# Patient Record
Sex: Male | Born: 1955
Health system: Southern US, Community
[De-identification: ages and names within clinical notes are randomized; demographics above are authoritative.]

## PROBLEM LIST (undated history)

## (undated) HISTORY — PX: HERNIA REPAIR: SHX51

---

## 2019-02-21 NOTE — Progress Notes (Addendum)
[place] [facility] [address], [place], [place] [address] [phone]  Date:  02/23/2019   Name:  Jacob Estes   DOB:  03-09-1956   MRN:  [id number]  PCP:  Darreld Mclean, MD    Chief Complaint: New Patient (Initial Visit) and Gastroesophageal Reflux   History of Present Illness:  Jacob Estes is a 63 y.o. very pleasant male patient who presents with the following:  Here today as a new patient to establish care His doctor is retiring- Dr Lajoyce Corners- so he needs to find someone new.  A friend of his is one of my patients and recommended that he see me He has lived in [place] for 40 years He is generally in good health Both of his parents are alive and doing well- his father does have skin cancer, and his mom had a heart valve replacement.  However generally they are vigorous and young for age  He has had 3 hernia operations in the past  He did get divorced 20 years ago- had some anxiety at that point and had palpitations They did find a "spot" on his lung at that time, benign ?granuloma He does tend to cough, he saw a doctor at [place] who thought he might have GERD- he was started on omeprazole which has helped with his cough and his phlegm production   He is using a natural supplement now in place of omeprazole which does seem to help- he ran out of his omeprazole.  Wonders if okay to continue to use this  He tends to have a limited appetite and will get sick if he overeats.   He has always been on the slim side He uses a fiber supplement which helps with his bowel regularity However for the last 6 months his bowels have been looser- not diarrhea- still goes daily No blood or mucus He notes that his stools are recently sticky and messy- he is having to go to the bathroom several times a day "to clean up" No recent abx use  He is not having diarrhea, so doubt C. difficile He did have giardia years ago- these sx were  reminiscent.  [name] did a stool sample for him which was negative for Giardia.  They also did an ultrasound which showed a "cyst on my liver" that is thought to be benign   He has had a couple of colonoscopies, most recently about 4 years ago.  His GI doc is [name]  He does exercise on a regular basis.  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  Graduated from [employer] in [employer]  He has 2 grown kids- daughter 44 and son is 2 yo, 2 grands just about a year old.  They live in [place] and [place]   There are no active problems to display for this patient.   History reviewed. No pertinent past medical history.  Past Surgical History:  Procedure Laterality Date  . HERNIA REPAIR      Social History   Tobacco Use  . Smoking status: Never Smoker  . Smokeless tobacco: Never Used  Substance  Use Topics  . Alcohol use: Yes    Comment: very very little  . Drug use: Not on file    History reviewed. No pertinent family history.  Allergies  Allergen Reactions  . Codeine Hives and Other (See Comments)    Heart problems Heart problems     Medication list has been reviewed and updated.  Current Outpatient Medications on File Prior to Visit  Medication Sig Dispense Refill  . omeprazole (PRILOSEC) 40 MG capsule Take 40 mg by mouth daily.     No current facility-administered medications on file prior to visit.     Review of Systems:  As per HPI- otherwise negative.   Physical Examination: Vitals:   02/23/19 0858  BP: 122/70  Pulse: 75  Resp: 16  Temp: (!) 97.1 F (36.2 C)  SpO2: 98%   Vitals:   02/23/19 0858  Weight: 183 lb (83 kg)  Height: 6\' 2"  (1.88 m)   Body mass  index is 23.5 kg/m. Ideal Body Weight: Weight in (lb) to have BMI = 25: 194.3  GEN: WDWN, NAD, Non-toxic, A & O x 3, looks well, slim build  HEENT: Atraumatic, Normocephalic. Neck supple. No masses, No LAD.  TM wnl  Ears and Nose: No external deformity. CV: RRR, No M/G/R. No JVD. No thrill. No extra heart sounds. PULM: CTA B, no wheezes, crackles, rhonchi. No retractions. No resp. distress. No accessory muscle use. ABD: S, NT, ND, +BS. No rebound. No HSM. EXTR: No c/c/e NEURO Normal gait.  PSYCH: Normally interactive. Conversant. Not depressed or anxious appearing.  Calm demeanor.  Onychomycosis is evident on his toenails   Assessment and Plan: Onychomycosis - Plan: ciclopirox (PENLAC) 8 % solution  Screening for diabetes mellitus - Plan: Comprehensive metabolic panel, Hemoglobin A1c  Screening for prostate cancer - Plan: PSA  Screening for hyperlipidemia - Plan: Lipid panel  Screening for deficiency anemia - Plan: CBC  Encounter for hepatitis C screening test for low risk patient - Plan: Hepatitis C antibody  Gastroesophageal reflux disease, unspecified whether esophagitis present - Plan: omeprazole (PRILOSEC) 40 MG capsule  Immunization due - Plan: Pneumococcal conjugate vaccine 13-valent IM  Bowel habit changes  Here today as a new patient to establish care and discuss a couple of concerns Give first pneumonia vaccine today, he plans to have shingles vaccine done at his drugstore Labs pending as above, will be in touch with the results We will treat his onychomycosis with Penlac, if not covered he will let me know and I can prescribe Lamisil. He preferred to try topical agent first if possible He notes bowel habit changes for several months, persistent.  I recommended that he follow-up with his gastroenterologist.  She may wish to repeat his colonoscopy as it has been a few years He can continue to use omeprazole as needed.  He might try pulse therapy so he does not have to  use it every day  Encourage continued exercise and healthy diet  Will plan further follow- up pending labs.   Signed Lamar Blinks, MD  Received his labs 11/5, letter to patient as mychart not yet set up  Results for orders placed or performed in visit on 02/23/19  CBC  Result Value Ref Range   WBC 4.2 4.0 - 10.5 K/uL   RBC 5.48 4.22 - 5.81 Mil/uL   Platelets 196.0 150.0 - 400.0 K/uL   Hemoglobin 16.0 13.0 - 17.0 g/dL   HCT 48.7 39.0 - 52.0 %   MCV 88.9 78.0 -  100.0 fl   MCHC 32.9 30.0 - 36.0 g/dL   RDW 14.2 11.5 - 15.5 %  Comprehensive metabolic panel  Result Value Ref Range   Sodium 139 135 - 145 mEq/L   Potassium 4.6 3.5 - 5.1 mEq/L   Chloride 103 96 - 112 mEq/L   CO2 30 19 - 32 mEq/L   Glucose, Bld 97 70 - 99 mg/dL   BUN 13 6 - 23 mg/dL   Creatinine, Ser 1.12 0.40 - 1.50 mg/dL   Total Bilirubin 0.6 0.2 - 1.2 mg/dL   Alkaline Phosphatase 36 (L) 39 - 117 U/L   AST 18 0 - 37 U/L   ALT 17 0 - 53 U/L   Total Protein 6.7 6.0 - 8.3 g/dL   Albumin 4.4 3.5 - 5.2 g/dL   Calcium 9.5 8.4 - 10.5 mg/dL   GFR 65.80 >60.00 mL/min  Hemoglobin A1c  Result Value Ref Range   Hgb A1c MFr Bld 5.5 4.6 - 6.5 %  Lipid panel  Result Value Ref Range   Cholesterol 178 0 - 200 mg/dL   Triglycerides 51.0 0.0 - 149.0 mg/dL   HDL 53.80 >39.00 mg/dL   VLDL 10.2 0.0 - 40.0 mg/dL   LDL Cholesterol 114 (H) 0 - 99 mg/dL   Total CHOL/HDL Ratio 3    NonHDL 124.45   PSA  Result Value Ref Range   PSA 0.93 0.10 - 4.00 ng/mL

## 2019-02-22 ENCOUNTER — Other Ambulatory Visit: Payer: Self-pay

## 2019-02-23 ENCOUNTER — Encounter: Payer: Self-pay | Admitting: Family Medicine

## 2019-02-23 ENCOUNTER — Ambulatory Visit (INDEPENDENT_AMBULATORY_CARE_PROVIDER_SITE_OTHER): Payer: Medicare Other | Admitting: Family Medicine

## 2019-02-23 VITALS — BP 122/70 | HR 75 | Temp 97.1°F | Resp 16 | Ht 74.0 in | Wt 183.0 lb

## 2019-02-23 DIAGNOSIS — B351 Tinea unguium: Secondary | ICD-10-CM

## 2019-02-23 DIAGNOSIS — Z23 Encounter for immunization: Secondary | ICD-10-CM

## 2019-02-23 DIAGNOSIS — Z1159 Encounter for screening for other viral diseases: Secondary | ICD-10-CM

## 2019-02-23 DIAGNOSIS — Z13220 Encounter for screening for lipoid disorders: Secondary | ICD-10-CM

## 2019-02-23 DIAGNOSIS — Z125 Encounter for screening for malignant neoplasm of prostate: Secondary | ICD-10-CM | POA: Diagnosis not present

## 2019-02-23 DIAGNOSIS — Z130 Encounter for screening for diseases of the blood and blood-forming organs and certain disorders involving the immune mechanism: Secondary | ICD-10-CM | POA: Diagnosis not present

## 2019-02-23 DIAGNOSIS — Z131 Encounter for screening for diabetes mellitus: Secondary | ICD-10-CM | POA: Diagnosis not present

## 2019-02-23 DIAGNOSIS — K219 Gastro-esophageal reflux disease without esophagitis: Secondary | ICD-10-CM

## 2019-02-23 DIAGNOSIS — R194 Change in bowel habit: Secondary | ICD-10-CM

## 2019-02-23 LAB — COMPREHENSIVE METABOLIC PANEL
ALT: 17 U/L (ref 0–53)
AST: 18 U/L (ref 0–37)
Albumin: 4.4 g/dL (ref 3.5–5.2)
Alkaline Phosphatase: 36 U/L — ABNORMAL LOW (ref 39–117)
BUN: 13 mg/dL (ref 6–23)
CO2: 30 mEq/L (ref 19–32)
Calcium: 9.5 mg/dL (ref 8.4–10.5)
Chloride: 103 mEq/L (ref 96–112)
Creatinine, Ser: 1.12 mg/dL (ref 0.40–1.50)
GFR: 65.8 mL/min (ref >60.00)
Glucose, Bld: 97 mg/dL (ref 70–99)
Potassium: 4.6 mEq/L (ref 3.5–5.1)
Sodium: 139 mEq/L (ref 135–145)
Total Bilirubin: 0.6 mg/dL (ref 0.2–1.2)
Total Protein: 6.7 g/dL (ref 6.0–8.3)

## 2019-02-23 LAB — CBC
HCT: 48.7 % (ref 39.0–52.0)
Hemoglobin: 16 g/dL (ref 13.0–17.0)
MCHC: 32.9 g/dL (ref 30.0–36.0)
MCV: 88.9 fl (ref 78.0–100.0)
Platelets: 196 10*3/uL (ref 150.0–400.0)
RBC: 5.48 Mil/uL (ref 4.22–5.81)
RDW: 14.2 % (ref 11.5–15.5)
WBC: 4.2 10*3/uL (ref 4.0–10.5)

## 2019-02-23 LAB — PSA: PSA: 0.93 ng/mL (ref 0.10–4.00)

## 2019-02-23 LAB — LIPID PANEL
Cholesterol: 178 mg/dL (ref 0–200)
HDL: 53.8 mg/dL (ref >39.00)
LDL Cholesterol: 114 mg/dL — ABNORMAL HIGH (ref 0–99)
NonHDL: 124.45
Total CHOL/HDL Ratio: 3
Triglycerides: 51 mg/dL (ref 0.0–149.0)
VLDL: 10.2 mg/dL (ref 0.0–40.0)

## 2019-02-23 LAB — HEMOGLOBIN A1C: Hgb A1c MFr Bld: 5.5 % (ref 4.6–6.5)

## 2019-02-23 MED ORDER — CICLOPIROX 8 % EX SOLN: Freq: Every day | CUTANEOUS | 6 refills | Status: DC

## 2019-02-23 MED ORDER — OMEPRAZOLE 40 MG PO CPDR: 40.0000 mg | DELAYED_RELEASE_CAPSULE | Freq: Every day | ORAL | 3 refills | Status: DC

## 2019-02-23 NOTE — Patient Instructions (Signed)
Good to meet you today- take care and I will be in touch with your labs asap You got your first dose of pneumonia vaccine today- next due in one year Please have your shingles vaccine at the pharmacy at your convenience- Shingrix, 2 doses   Try the Penlac nail laquer for fungal nails- if not covered by your insurance we can use lamisil orally for 12 weeks- please let me know if you need me to make this change   Please follow-up with Dr. Shana Chute regarding your bowel habit change -  [phone]  I will communicate all lab results to you always- if you do not hear from me something went wrong and please contact me.  Will use mychart preferentially if set up

## 2019-02-24 LAB — HEPATITIS C ANTIBODY
Hepatitis C Ab: NONREACTIVE
SIGNAL TO CUT-OFF: 0.01 (ref <1.00)

## 2019-03-03 DIAGNOSIS — R198 Other specified symptoms and signs involving the digestive system and abdomen: Secondary | ICD-10-CM | POA: Diagnosis not present

## 2019-03-03 DIAGNOSIS — Z86010 Personal history of colonic polyps: Secondary | ICD-10-CM | POA: Diagnosis not present

## 2019-03-03 DIAGNOSIS — R1310 Dysphagia, unspecified: Secondary | ICD-10-CM | POA: Diagnosis not present

## 2019-03-03 DIAGNOSIS — R05 Cough: Secondary | ICD-10-CM | POA: Diagnosis not present

## 2019-04-05 DIAGNOSIS — K5730 Diverticulosis of large intestine without perforation or abscess without bleeding: Secondary | ICD-10-CM | POA: Diagnosis not present

## 2019-04-05 DIAGNOSIS — K2960 Other gastritis without bleeding: Secondary | ICD-10-CM | POA: Diagnosis not present

## 2019-04-05 DIAGNOSIS — D123 Benign neoplasm of transverse colon: Secondary | ICD-10-CM | POA: Diagnosis not present

## 2019-04-05 DIAGNOSIS — R194 Change in bowel habit: Secondary | ICD-10-CM | POA: Diagnosis not present

## 2019-04-05 DIAGNOSIS — K2950 Unspecified chronic gastritis without bleeding: Secondary | ICD-10-CM | POA: Diagnosis not present

## 2019-04-05 DIAGNOSIS — K6389 Other specified diseases of intestine: Secondary | ICD-10-CM | POA: Diagnosis not present

## 2019-04-05 DIAGNOSIS — Z86010 Personal history of colonic polyps: Secondary | ICD-10-CM | POA: Diagnosis not present

## 2019-04-05 DIAGNOSIS — K529 Noninfective gastroenteritis and colitis, unspecified: Secondary | ICD-10-CM | POA: Diagnosis not present

## 2019-04-05 DIAGNOSIS — D125 Benign neoplasm of sigmoid colon: Secondary | ICD-10-CM | POA: Diagnosis not present

## 2019-04-05 DIAGNOSIS — R1310 Dysphagia, unspecified: Secondary | ICD-10-CM | POA: Diagnosis not present

## 2019-04-05 DIAGNOSIS — K635 Polyp of colon: Secondary | ICD-10-CM | POA: Diagnosis not present

## 2019-04-05 LAB — HM COLONOSCOPY

## 2019-04-18 DIAGNOSIS — K5730 Diverticulosis of large intestine without perforation or abscess without bleeding: Secondary | ICD-10-CM | POA: Diagnosis not present

## 2019-04-18 DIAGNOSIS — R05 Cough: Secondary | ICD-10-CM | POA: Diagnosis not present

## 2019-04-18 DIAGNOSIS — K2970 Gastritis, unspecified, without bleeding: Secondary | ICD-10-CM | POA: Diagnosis not present

## 2019-04-18 DIAGNOSIS — T17308D Unspecified foreign body in larynx causing other injury, subsequent encounter: Secondary | ICD-10-CM | POA: Diagnosis not present

## 2019-04-18 DIAGNOSIS — K219 Gastro-esophageal reflux disease without esophagitis: Secondary | ICD-10-CM | POA: Diagnosis not present

## 2019-06-07 ENCOUNTER — Ambulatory Visit: Payer: Medicare Other | Attending: Internal Medicine

## 2019-06-07 DIAGNOSIS — Z20822 Contact with and (suspected) exposure to covid-19: Secondary | ICD-10-CM

## 2019-06-08 ENCOUNTER — Telehealth: Payer: Self-pay | Admitting: Physician Assistant

## 2019-06-08 ENCOUNTER — Other Ambulatory Visit: Payer: Self-pay | Admitting: Physician Assistant

## 2019-06-08 DIAGNOSIS — U071 COVID-19: Secondary | ICD-10-CM

## 2019-06-08 DIAGNOSIS — R54 Age-related physical debility: Secondary | ICD-10-CM

## 2019-06-08 LAB — NOVEL CORONAVIRUS, NAA: SARS-CoV-2, NAA: DETECTED — AB

## 2019-06-08 NOTE — Telephone Encounter (Signed)
Called to discuss with Guinevere Scarlet III about Covid symptoms and the use of bamlanivimab or casirivimab/imdevimab, a monoclonal antibody infusion for those with mild to moderate Covid symptoms and at a high risk of hospitalization.     Pt is qualified for this infusion at the [facility] infusion center due to co-morbid conditions and/or a member of an at-risk group (age >47), however declines infusion at this time. Symptoms tier reviewed as well as criteria for ending isolation.  Symptoms reviewed that would warrant ED/Hospital evaluation. Preventative practices reviewed. Patient verbalized understanding. Patient advised to call back if he decides that he does want to get infusion. Callback number to the infusion center given. Patient advised to go to Urgent care or ED with severe symptoms. Last date pt would be eligible for infusion is 06/14/19. He would like to think about it. I have sent him a My Chart message with information on how to get a hold of [place] if he wants to get the infusion.    Angelena Form PA-C

## 2019-06-08 NOTE — Progress Notes (Signed)
  I connected by phone with Jacob Estes on 06/08/2019 at 8:31 PM to discuss the potential use of an new treatment for mild to moderate COVID-19 viral infection in non-hospitalized patients.  This patient is a 64 y.o. male that meets the FDA criteria for Emergency Use Authorization of bamlanivimab or casirivimab\imdevimab.  Has a (+) direct SARS-CoV-2 viral test result  Has mild or moderate COVID-19   Is ? 66 years of age and weighs ? 40 kg  Is NOT hospitalized due to COVID-19  Is NOT requiring oxygen therapy or requiring an increase in baseline oxygen flow rate due to COVID-19  Is within 10 days of symptom onset  Has at least one of the high risk factor(s) for progression to severe COVID-19 and/or hospitalization as defined in EUA.  Specific high risk criteria : >/= 66 yo   I have spoken and communicated the following to the patient or parent/caregiver:  1. FDA has authorized the emergency use of bamlanivimab and casirivimab\imdevimab for the treatment of mild to moderate COVID-19 in adults and pediatric patients with positive results of direct SARS-CoV-2 viral testing who are 71 years of age and older weighing at least 40 kg, and who are at high risk for progressing to severe COVID-19 and/or hospitalization.  2. The significant known and potential risks and benefits of bamlanivimab and casirivimab\imdevimab, and the extent to which such potential risks and benefits are unknown.  3. Information on available alternative treatments and the risks and benefits of those alternatives, including clinical trials.  4. Patients treated with bamlanivimab and casirivimab\imdevimab should continue to self-isolate and use infection control measures (e.g., wear mask, isolate, social distance, avoid sharing personal items, clean and disinfect "high touch" surfaces, and frequent handwashing) according to CDC guidelines.   5. The patient or parent/caregiver has the option to accept or refuse  bamlanivimab or casirivimab\imdevimab .  After reviewing this information with the patient, The patient agreed to proceed with receiving the bamlanimivab infusion and will be provided a copy of the Fact sheet prior to receiving the infusion.   Sx onset 2/13. Set up for infusion tomorrow 2/18 @ 2:30pm (subject to change given possible inclement weather).    Angelena Form PA-C 06/08/2019 8:31 PM   CC: Dr. Lorelei Pont

## 2019-06-09 ENCOUNTER — Ambulatory Visit (HOSPITAL_COMMUNITY)
Admission: RE | Admit: 2019-06-09 | Discharge: 2019-06-09 | Disposition: A | Discharge status: Home / Self Care | Payer: Medicare Other | Source: Ambulatory Visit | Attending: Pulmonary Disease | Admitting: Pulmonary Disease

## 2019-06-09 ENCOUNTER — Telehealth (HOSPITAL_COMMUNITY): Payer: Self-pay | Admitting: Nurse Practitioner

## 2019-06-09 DIAGNOSIS — U071 COVID-19: Secondary | ICD-10-CM | POA: Insufficient documentation

## 2019-06-09 DIAGNOSIS — R54 Age-related physical debility: Secondary | ICD-10-CM | POA: Diagnosis present

## 2019-06-09 DIAGNOSIS — Z23 Encounter for immunization: Secondary | ICD-10-CM | POA: Insufficient documentation

## 2019-06-09 MED ORDER — FAMOTIDINE IN NACL 20-0.9 MG/50ML-% IV SOLN: 20.0000 mg | Freq: Once | INTRAVENOUS | Status: DC | PRN

## 2019-06-09 MED ORDER — EPINEPHRINE 0.3 MG/0.3ML IJ SOAJ: 0.3000 mg | Freq: Once | INTRAMUSCULAR | Status: DC | PRN

## 2019-06-09 MED ORDER — SODIUM CHLORIDE 0.9 % IV SOLN
INTRAVENOUS | Status: DC | PRN
  Administered 2019-06-09: 14:00:00 250 mL via INTRAVENOUS

## 2019-06-09 MED ORDER — ALBUTEROL SULFATE HFA 108 (90 BASE) MCG/ACT IN AERS: 2.0000 | INHALATION_SPRAY | Freq: Once | RESPIRATORY_TRACT | Status: DC | PRN

## 2019-06-09 MED ORDER — DIPHENHYDRAMINE HCL 50 MG/ML IJ SOLN: 50.0000 mg | Freq: Once | INTRAMUSCULAR | Status: DC | PRN

## 2019-06-09 MED ORDER — SODIUM CHLORIDE 0.9 % IV SOLN
700.0000 mg | Freq: Once | INTRAVENOUS | Status: AC
  Administered 2019-06-09: 15:00:00 700 mg via INTRAVENOUS
  Filled 2019-06-09: qty 20

## 2019-06-09 MED ORDER — METHYLPREDNISOLONE SODIUM SUCC 125 MG IJ SOLR: 125.0000 mg | Freq: Once | INTRAMUSCULAR | Status: DC | PRN

## 2019-06-09 NOTE — Telephone Encounter (Signed)
Called to Discuss with patient about Covid symptoms and the use of bamlanivimab, a monoclonal antibody infusion for those with mild to moderate Covid symptoms and at a high risk of hospitalization.     Pt is qualified for this infusion at the [facility] infusion center due to co-morbid conditions and/or a member of an at-risk group.  He has spoken to [facility] and is scheduled to get MAB treatment today. We discussed symptoms and medications to help control symptoms in the interim. ER precautions discussed.   Beckey Rutter, [place], AGNP-C [phone] ([place])

## 2019-06-09 NOTE — Discharge Instructions (Signed)

## 2019-06-09 NOTE — Progress Notes (Signed)
  Diagnosis: COVID-19  Physician: Dr. Joya Gaskins   Procedure: Covid Infusion Clinic Med: bamlanivimab infusion - Provided patient with bamlanimivab fact sheet for patients, parents and caregivers prior to infusion.  Complications: No immediate complications noted.  Discharge: Discharged home   Jacob Estes 06/09/2019

## 2019-06-21 ENCOUNTER — Encounter: Payer: Self-pay | Admitting: Family Medicine

## 2019-07-19 DIAGNOSIS — R05 Cough: Secondary | ICD-10-CM | POA: Diagnosis not present

## 2019-07-19 DIAGNOSIS — R1313 Dysphagia, pharyngeal phase: Secondary | ICD-10-CM | POA: Diagnosis not present

## 2019-07-19 DIAGNOSIS — T17308D Unspecified foreign body in larynx causing other injury, subsequent encounter: Secondary | ICD-10-CM | POA: Diagnosis not present

## 2019-07-27 ENCOUNTER — Encounter: Payer: Self-pay | Admitting: Family Medicine

## 2019-10-31 ENCOUNTER — Encounter: Payer: Self-pay | Admitting: Family Medicine

## 2019-10-31 NOTE — Telephone Encounter (Signed)
Last ov 02/23/2019-please advise on other patients concern.

## 2019-11-09 DIAGNOSIS — L814 Other melanin hyperpigmentation: Secondary | ICD-10-CM | POA: Diagnosis not present

## 2019-11-09 DIAGNOSIS — L820 Inflamed seborrheic keratosis: Secondary | ICD-10-CM | POA: Diagnosis not present

## 2019-11-09 DIAGNOSIS — X32XXXS Exposure to sunlight, sequela: Secondary | ICD-10-CM | POA: Diagnosis not present

## 2019-11-09 DIAGNOSIS — B351 Tinea unguium: Secondary | ICD-10-CM | POA: Diagnosis not present

## 2019-11-09 DIAGNOSIS — L821 Other seborrheic keratosis: Secondary | ICD-10-CM | POA: Diagnosis not present

## 2019-11-14 DIAGNOSIS — B351 Tinea unguium: Secondary | ICD-10-CM | POA: Diagnosis not present

## 2019-11-14 DIAGNOSIS — Z79899 Other long term (current) drug therapy: Secondary | ICD-10-CM | POA: Diagnosis not present

## 2019-12-21 DIAGNOSIS — Z79899 Other long term (current) drug therapy: Secondary | ICD-10-CM | POA: Diagnosis not present

## 2019-12-21 DIAGNOSIS — B351 Tinea unguium: Secondary | ICD-10-CM | POA: Diagnosis not present

## 2020-01-30 ENCOUNTER — Encounter: Payer: Self-pay | Admitting: Family Medicine

## 2020-02-07 NOTE — Progress Notes (Addendum)
[place] at [facility] [address] [place], [place] [address] [phone]  Date:  02/08/2020   Name:  Jacob Estes   DOB:  12/06/1955   MRN:  [id number]  PCP:  Darreld Mclean, MD    Chief Complaint: Anxiety (2 weeks ago, waking up in the night in a panic), Cough (coughing up thick phlem, feels as though hes drowning), and Smell of Smoke   History of Present Illness:  Jacob Estes is a 64 y.o. very pleasant male patient who presents with the following:  Generally healthy man here today for follow-up Our last routine visit was about 1 year ago History of 3 hernia operations, GERD He is divorced, works as a Programme researcher, broadcasting/film/video.  He has 2 grown children who live out of state and 2 grandchildren-everyone in his family is doing well He works as much as he likes to at this point- he just did [employer] but does not feel this was stressful.   His parents are getting older - this is a bit of a stressor, his mother struggling some with depression  He became ill with COVID-19 in February, treated with monoclonal antibody infusion He had received 1 dose of Moderna COVID-19 vaccine, became ill the next day and was diagnosed with Covid  18 months ago or so he was having some issues with waking up in the night feeling like he "was drowning," he went to see GI and they did an upper and lower GI.  Results were ok, he was started on omeprazole He seemed to be doing okay in this regard, until 2 weeks ago he apparently woke up with what seemed like a panic attack-he felt like he was choking, he was not sure if he really has not caught in his throat or if he was just panicking.  He notes he has been suffering more from anxiety since this happened and has had a couple more panic attacks He got up 4x last night to urinate which is unusual for him  He has suffered from panic in the past, when he was separated from his first wife-however  more recently his life has be very good and he has not experienced anxiety or depression  He does note he may feel chest pressure when he is very anxious-he thinks this is just anxiety, no chest pain Never smoker, does not drink to excess  Colon cancer screening- last year  Flu vaccine- done  He finished his covid 19 vaccine series Most recent labs on chart from November 2020, can update today  He does exercise on a regular basis with no CP or SOB with exertion -he does both weightlifting and cardio workout several times a week  He also recently notes that he may smell cigarette smoke in the air when there is none, and also has bilateral, nonpulsatile tinnitus There are no problems to display for this patient.   History reviewed. No pertinent past medical history.  Past Surgical History:  Procedure Laterality Date  . HERNIA REPAIR      Social History   Tobacco Use  . Smoking status: Never Smoker  . Smokeless tobacco: Never Used  Substance Use Topics  . Alcohol use: Yes    Comment: very very little  . Drug use: Not on file    History reviewed. No pertinent family history.  Allergies  Allergen Reactions  . Codeine Hives and Other (See Comments)  Heart problems Heart problems     Medication list has been reviewed and updated.  Current Outpatient Medications on File Prior to Visit  Medication Sig Dispense Refill  . loratadine (CLARITIN) 10 MG tablet Take 10 mg by mouth daily.    Marland Kitchen omeprazole (PRILOSEC) 40 MG capsule Take 1 capsule (40 mg total) by mouth daily. Use as needed for GERD sx 90 capsule 3  . terbinafine (LAMISIL) 250 MG tablet Take 250 mg by mouth daily.     No current facility-administered medications on file prior to visit.    Review of Systems:  As per HPI- otherwise negative.   Physical Examination: Vitals:   02/08/20 1357  BP: 136/80  Pulse: 76  Resp: 17  SpO2: 96%   Vitals:   02/08/20 1357  Weight: 179 lb (81.2 kg)  Height: 6\' 2"   (1.88 m)   Body mass index is 22.98 kg/m. Ideal Body Weight: Weight in (lb) to have BMI = 25: 194.3  GEN: no acute distress.  Slim build, looks well HEENT: Atraumatic, Normocephalic.  Bilateral TM wnl, oropharynx normal.  PEERL,EOMI.    Ears and Nose: No external deformity. CV: RRR, No M/G/R. No JVD. No thrill. No extra heart sounds. PULM: CTA B, no wheezes, crackles, rhonchi. No retractions. No resp. distress. No accessory muscle use. ABD: S, NT, ND, +BS. No rebound. No HSM. EXTR: No c/c/e PSYCH: Normally interactive. Conversant.   EKG- no old tracing for comparison SR with possible LAE.  No concerning ST changes  Assessment and Plan: Screening for diabetes mellitus - Plan: Comprehensive metabolic panel, Hemoglobin A1c  Screening for prostate cancer - Plan: PSA  Screening for deficiency anemia - Plan: CBC  Screening for hyperlipidemia - Plan: Lipid panel  Urinary urgency - Plan: Urine Culture, tamsulosin (FLOMAX) 0.4 MG CAPS capsule  Chest pressure - Plan: CANCELED: EKG 12-Lead  Chest tightness - Plan: EKG 12-Lead  Panic attack - Plan: ALPRAZolam (XANAX) 0.5 MG tablet     Patient here today with likely panic attack, perhaps exacerbated by throat irritation due to GERD or postnasal drainage He will start back on loratadine, and will also purchase an over-the-counter nasal steroid spray He had a recent upper GI per an outside gastroenterology practice, I have requested this report EKG at this time is reassuring, and he does not have any symptoms with vigorous exercise.  However, I have asked him to let me know if his symptoms change or worsen, or if he begins to have any symptoms with exercise  I gave him a few alprazolam to have on hand and use as needed for panic I offered to have him see ENT for tinnitus, he declines for the time being  We will be in touch with his labs as soon as possible  He has noted some symptoms of urinary frequency recently, no pain.  Suspect he  likely has BPH.  He is interested in trying Flomax which we will prescribe.  Also will check a urine culture to rule out UTI This visit occurred during the SARS-CoV-2 public health emergency.  Safety protocols were in place, including screening questions prior to the visit, additional usage of staff PPE, and extensive cleaning of exam room while observing appropriate contact time as indicated for disinfecting solutions.    Signed Lamar Blinks, MD  Addendum 10/21, received labs as below.  Urine culture pending Results for orders placed or performed in visit on 02/08/20  CBC  Result Value Ref Range   WBC 6.0 3.8 -  10.8 Thousand/uL   RBC 5.14 4.20 - 5.80 Million/uL   Hemoglobin 15.5 13.2 - 17.1 g/dL   HCT 45.5 38 - 50 %   MCV 88.5 80.0 - 100.0 fL   MCH 30.2 27.0 - 33.0 pg   MCHC 34.1 32.0 - 36.0 g/dL   RDW 13.1 11.0 - 15.0 %   Platelets 204 140 - 400 Thousand/uL   MPV 11.2 7.5 - 12.5 fL  Comprehensive metabolic panel  Result Value Ref Range   Glucose, Bld 64 (L) 65 - 99 mg/dL   BUN 13 7 - 25 mg/dL   Creat 1.08 0.70 - 1.25 mg/dL   BUN/Creatinine Ratio NOT APPLICABLE 6 - 22 (calc)   Sodium 140 135 - 146 mmol/L   Potassium 4.1 3.5 - 5.3 mmol/L   Chloride 105 98 - 110 mmol/L   CO2 26 20 - 32 mmol/L   Calcium 9.5 8.6 - 10.3 mg/dL   Total Protein 6.6 6.1 - 8.1 g/dL   Albumin 4.4 3.6 - 5.1 g/dL   Globulin 2.2 1.9 - 3.7 g/dL (calc)   AG Ratio 2.0 1.0 - 2.5 (calc)   Total Bilirubin 0.5 0.2 - 1.2 mg/dL   Alkaline phosphatase (APISO) 32 (L) 35 - 144 U/L   AST 16 10 - 35 U/L   ALT 18 9 - 46 U/L  Hemoglobin A1c  Result Value Ref Range   Hgb A1c MFr Bld 5.3 <5.7 % of total Hgb   Mean Plasma Glucose 105 (calc)   eAG (mmol/L) 5.8 (calc)  Lipid panel  Result Value Ref Range   Cholesterol 180 <200 mg/dL   HDL 61 > OR = 40 mg/dL   Triglycerides 80 <150 mg/dL   LDL Cholesterol (Calc) 102 (H) mg/dL (calc)   Total CHOL/HDL Ratio 3.0 <5.0 (calc)   Non-HDL Cholesterol (Calc) 119 <130  mg/dL (calc)  PSA  Result Value Ref Range   PSA 0.65 < OR = 4.0 ng/mL

## 2020-02-08 ENCOUNTER — Other Ambulatory Visit: Payer: Self-pay

## 2020-02-08 ENCOUNTER — Ambulatory Visit (INDEPENDENT_AMBULATORY_CARE_PROVIDER_SITE_OTHER): Payer: Medicare Other | Admitting: Family Medicine

## 2020-02-08 ENCOUNTER — Encounter: Payer: Self-pay | Admitting: Family Medicine

## 2020-02-08 VITALS — BP 136/80 | HR 76 | Resp 17 | Ht 74.0 in | Wt 179.0 lb

## 2020-02-08 DIAGNOSIS — R3915 Urgency of urination: Secondary | ICD-10-CM

## 2020-02-08 DIAGNOSIS — F410 Panic disorder [episodic paroxysmal anxiety] without agoraphobia: Secondary | ICD-10-CM

## 2020-02-08 DIAGNOSIS — Z125 Encounter for screening for malignant neoplasm of prostate: Secondary | ICD-10-CM

## 2020-02-08 DIAGNOSIS — R0789 Other chest pain: Secondary | ICD-10-CM

## 2020-02-08 DIAGNOSIS — Z130 Encounter for screening for diseases of the blood and blood-forming organs and certain disorders involving the immune mechanism: Secondary | ICD-10-CM

## 2020-02-08 DIAGNOSIS — Z131 Encounter for screening for diabetes mellitus: Secondary | ICD-10-CM

## 2020-02-08 DIAGNOSIS — B351 Tinea unguium: Secondary | ICD-10-CM | POA: Diagnosis not present

## 2020-02-08 DIAGNOSIS — L821 Other seborrheic keratosis: Secondary | ICD-10-CM | POA: Diagnosis not present

## 2020-02-08 DIAGNOSIS — Z13220 Encounter for screening for lipoid disorders: Secondary | ICD-10-CM | POA: Diagnosis not present

## 2020-02-08 MED ORDER — TAMSULOSIN HCL 0.4 MG PO CAPS: 0.4000 mg | ORAL_CAPSULE | Freq: Every day | ORAL | 3 refills | Status: DC

## 2020-02-08 MED ORDER — ALPRAZOLAM 0.5 MG PO TABS: 0.5000 mg | ORAL_TABLET | Freq: Every evening | ORAL | 0 refills | Status: DC | PRN

## 2020-02-08 NOTE — Patient Instructions (Addendum)
Good to see you again today- I will be in touch with your labs asap For possible allergies/ post nasal drainage use the loratadine and perhaps also a nasal steroid spray such as flonase or nasacort I gave you some alprazolam/ xanax to use as needed for panic attack You can take a 1/2 or whole tablet up to three times a day as needed for anxiety- do not drive after taking this medication   Please let me know how you are doing over the next week or so!

## 2020-02-09 ENCOUNTER — Encounter: Payer: Self-pay | Admitting: Family Medicine

## 2020-02-09 LAB — CBC
HCT: 45.5 % (ref 38.5–50.0)
Hemoglobin: 15.5 g/dL (ref 13.2–17.1)
MCH: 30.2 pg (ref 27.0–33.0)
MCHC: 34.1 g/dL (ref 32.0–36.0)
MCV: 88.5 fL (ref 80.0–100.0)
MPV: 11.2 fL (ref 7.5–12.5)
Platelets: 204 10*3/uL (ref 140–400)
RBC: 5.14 10*6/uL (ref 4.20–5.80)
RDW: 13.1 % (ref 11.0–15.0)
WBC: 6 10*3/uL (ref 3.8–10.8)

## 2020-02-09 LAB — LIPID PANEL
Cholesterol: 180 mg/dL (ref <200)
HDL: 61 mg/dL (ref >=40)
LDL Cholesterol (Calc): 102 mg/dL (calc) — ABNORMAL HIGH
Non-HDL Cholesterol (Calc): 119 mg/dL (calc) (ref <130)
Total CHOL/HDL Ratio: 3 (calc) (ref <5.0)
Triglycerides: 80 mg/dL (ref <150)

## 2020-02-09 LAB — PSA: PSA: 0.65 ng/mL (ref <=4.0)

## 2020-02-09 LAB — HEMOGLOBIN A1C
Hgb A1c MFr Bld: 5.3 % of total Hgb (ref <5.7)
Mean Plasma Glucose: 105 (calc)
eAG (mmol/L): 5.8 (calc)

## 2020-02-09 LAB — COMPREHENSIVE METABOLIC PANEL
AG Ratio: 2 (calc) (ref 1.0–2.5)
ALT: 18 U/L (ref 9–46)
AST: 16 U/L (ref 10–35)
Albumin: 4.4 g/dL (ref 3.6–5.1)
Alkaline phosphatase (APISO): 32 U/L — ABNORMAL LOW (ref 35–144)
BUN: 13 mg/dL (ref 7–25)
CO2: 26 mmol/L (ref 20–32)
Calcium: 9.5 mg/dL (ref 8.6–10.3)
Chloride: 105 mmol/L (ref 98–110)
Creat: 1.08 mg/dL (ref 0.70–1.25)
Globulin: 2.2 g/dL (calc) (ref 1.9–3.7)
Glucose, Bld: 64 mg/dL — ABNORMAL LOW (ref 65–99)
Potassium: 4.1 mmol/L (ref 3.5–5.3)
Sodium: 140 mmol/L (ref 135–146)
Total Bilirubin: 0.5 mg/dL (ref 0.2–1.2)
Total Protein: 6.6 g/dL (ref 6.1–8.1)

## 2020-02-09 LAB — URINE CULTURE
MICRO NUMBER:: 11095994
Result:: NO GROWTH
SPECIMEN QUALITY:: ADEQUATE

## 2020-02-10 ENCOUNTER — Encounter: Payer: Self-pay | Admitting: Family Medicine

## 2020-02-18 ENCOUNTER — Other Ambulatory Visit: Payer: Self-pay

## 2020-02-18 ENCOUNTER — Encounter (HOSPITAL_BASED_OUTPATIENT_CLINIC_OR_DEPARTMENT_OTHER): Payer: Self-pay | Admitting: *Deleted

## 2020-02-18 ENCOUNTER — Emergency Department (HOSPITAL_BASED_OUTPATIENT_CLINIC_OR_DEPARTMENT_OTHER)
Admission: EM | Admit: 2020-02-18 | Discharge: 2020-02-18 | Disposition: A | Discharge status: Home / Self Care | Payer: Medicare Other | Attending: Emergency Medicine | Admitting: Emergency Medicine

## 2020-02-18 DIAGNOSIS — R110 Nausea: Secondary | ICD-10-CM | POA: Insufficient documentation

## 2020-02-18 DIAGNOSIS — R1013 Epigastric pain: Secondary | ICD-10-CM | POA: Insufficient documentation

## 2020-02-18 LAB — CBC WITH DIFFERENTIAL/PLATELET
Abs Immature Granulocytes: 0.08 10*3/uL — ABNORMAL HIGH (ref 0.00–0.07)
Basophils Absolute: 0 10*3/uL (ref 0.0–0.1)
Basophils Relative: 0 %
Eosinophils Absolute: 0 10*3/uL (ref 0.0–0.5)
Eosinophils Relative: 0 %
HCT: 46.7 % (ref 39.0–52.0)
Hemoglobin: 15.5 g/dL (ref 13.0–17.0)
Immature Granulocytes: 0 %
Lymphocytes Relative: 6 %
Lymphs Abs: 1.1 10*3/uL (ref 0.7–4.0)
MCH: 29.7 pg (ref 26.0–34.0)
MCHC: 33.2 g/dL (ref 30.0–36.0)
MCV: 89.5 fL (ref 80.0–100.0)
Monocytes Absolute: 1.3 10*3/uL — ABNORMAL HIGH (ref 0.1–1.0)
Monocytes Relative: 7 %
Neutro Abs: 16.1 10*3/uL — ABNORMAL HIGH (ref 1.7–7.7)
Neutrophils Relative %: 87 %
Platelets: 204 10*3/uL (ref 150–400)
RBC: 5.22 MIL/uL (ref 4.22–5.81)
RDW: 13.6 % (ref 11.5–15.5)
WBC: 18.6 10*3/uL — ABNORMAL HIGH (ref 4.0–10.5)
nRBC: 0 % (ref 0.0–0.2)

## 2020-02-18 LAB — COMPREHENSIVE METABOLIC PANEL
ALT: 21 U/L (ref 0–44)
AST: 19 U/L (ref 15–41)
Albumin: 4.1 g/dL (ref 3.5–5.0)
Alkaline Phosphatase: 30 U/L — ABNORMAL LOW (ref 38–126)
Anion gap: 9 (ref 5–15)
BUN: 16 mg/dL (ref 8–23)
CO2: 27 mmol/L (ref 22–32)
Calcium: 9.1 mg/dL (ref 8.9–10.3)
Chloride: 103 mmol/L (ref 98–111)
Creatinine, Ser: 1.09 mg/dL (ref 0.61–1.24)
GFR, Estimated: >60 mL/min (ref >60)
Glucose, Bld: 95 mg/dL (ref 70–99)
Potassium: 4.4 mmol/L (ref 3.5–5.1)
Sodium: 139 mmol/L (ref 135–145)
Total Bilirubin: 0.5 mg/dL (ref 0.3–1.2)
Total Protein: 6.6 g/dL (ref 6.5–8.1)

## 2020-02-18 LAB — LIPASE, BLOOD: Lipase: 32 U/L (ref 11–51)

## 2020-02-18 MED ORDER — LIDOCAINE VISCOUS HCL 2 % MT SOLN
15.0000 mL | Freq: Once | OROMUCOSAL | Status: DC
  Filled 2020-02-18: qty 15

## 2020-02-18 MED ORDER — ALUM & MAG HYDROXIDE-SIMETH 200-200-20 MG/5ML PO SUSP
30.0000 mL | Freq: Once | ORAL | Status: DC
  Filled 2020-02-18: qty 30

## 2020-02-18 MED ORDER — SUCRALFATE 1 G PO TABS: 1.0000 g | ORAL_TABLET | Freq: Three times a day (TID) | ORAL | 0 refills | Status: DC

## 2020-02-18 NOTE — ED Provider Notes (Signed)
[facility] EMERGENCY DEPARTMENT Provider Note   CSN: [id number] Arrival date & time: 02/18/20  1823     History Chief Complaint  Patient presents with  . Abdominal Pain    Jacob Estes is a 64 y.o. male.  The history is provided by the patient.  Abdominal Pain Pain location:  Epigastric Pain quality: aching   Pain radiates to:  Does not radiate Pain severity:  Mild Onset quality:  Gradual Timing:  Constant Progression:  Unchanged Chronicity:  New Associated symptoms: nausea   Associated symptoms: no chest pain, no chills, no cough, no dysuria, no fever, no hematuria, no shortness of breath, no sore throat and no vomiting        History reviewed. No pertinent past medical history.  There are no problems to display for this patient.   Past Surgical History:  Procedure Laterality Date  . HERNIA REPAIR         No family history on file.  Social History   Tobacco Use  . Smoking status: Never Smoker  . Smokeless tobacco: Never Used  Vaping Use  . Vaping Use: Never used  Substance Use Topics  . Alcohol use: Yes    Comment: very very little  . Drug use: Never    Home Medications Prior to Admission medications   Medication Sig Start Date End Date Taking? Authorizing Provider  ALPRAZolam Duanne Moron) 0.5 MG tablet Take 1 tablet (0.5 mg total) by mouth at bedtime as needed for anxiety. 02/08/20   Copland, Gay Filler, MD  loratadine (CLARITIN) 10 MG tablet Take 10 mg by mouth daily.    [provider]  omeprazole (PRILOSEC) 40 MG capsule Take 1 capsule (40 mg total) by mouth daily. Use as needed for GERD sx 02/23/19   Copland, Gay Filler, MD  sucralfate (CARAFATE) 1 g tablet Take 1 tablet (1 g total) by mouth 4 (four) times daily -  with meals and at bedtime for 14 days. 02/18/20 03/03/20  Harris Penton, DO  tamsulosin (FLOMAX) 0.4 MG CAPS capsule Take 1 capsule (0.4 mg total) by mouth daily. 02/08/20   Copland, Gay Filler, MD  terbinafine  (LAMISIL) 250 MG tablet Take 250 mg by mouth daily.    [provider]    Allergies    Codeine  Review of Systems   Review of Systems  Constitutional: Negative for chills and fever.  HENT: Negative for ear pain and sore throat.   Eyes: Negative for pain and visual disturbance.  Respiratory: Negative for cough and shortness of breath.   Cardiovascular: Negative for chest pain and palpitations.  Gastrointestinal: Positive for abdominal pain and nausea. Negative for vomiting.  Genitourinary: Negative for dysuria and hematuria.  Musculoskeletal: Negative for arthralgias and back pain.  Skin: Negative for color change and rash.  Neurological: Negative for seizures and syncope.  All other systems reviewed and are negative.   Physical Exam Updated Vital Signs BP 138/78 (BP Location: Left Arm)   Pulse 60   Temp 98 F (36.7 C) (Oral)   Resp 18   Ht 6\' 2"  (1.88 m)   Wt 81.6 kg   SpO2 99%   BMI 23.11 kg/m   Physical Exam Vitals and nursing note reviewed.  Constitutional:      General: He is not in acute distress.    Appearance: He is well-developed. He is not ill-appearing.  HENT:     Head: Normocephalic and atraumatic.  Eyes:     Extraocular Movements: Extraocular movements intact.  Conjunctiva/sclera: Conjunctivae normal.  Cardiovascular:     Rate and Rhythm: Normal rate and regular rhythm.     Heart sounds: Normal heart sounds. No murmur heard.   Pulmonary:     Effort: Pulmonary effort is normal. No respiratory distress.     Breath sounds: Normal breath sounds.  Abdominal:     General: There is no distension.     Palpations: Abdomen is soft.     Tenderness: There is no abdominal tenderness.  Musculoskeletal:     Cervical back: Neck supple.  Skin:    General: Skin is warm and dry.     Capillary Refill: Capillary refill takes less than 2 seconds.  Neurological:     General: No focal deficit present.     Mental Status: He is alert.  Psychiatric:         Mood and Affect: Mood normal.     ED Results / Procedures / Treatments   Labs (all labs ordered are listed, but only abnormal results are displayed) Labs Reviewed  CBC WITH DIFFERENTIAL/PLATELET - Abnormal; Notable for the following components:      Result Value   WBC 18.6 (*)    Neutro Abs 16.1 (*)    Monocytes Absolute 1.3 (*)    Abs Immature Granulocytes 0.08 (*)    All other components within normal limits  COMPREHENSIVE METABOLIC PANEL - Abnormal; Notable for the following components:   Alkaline Phosphatase 30 (*)    All other components within normal limits  LIPASE, BLOOD    EKG EKG Interpretation  Date/Time:  Saturday February 18 2020 19:48:47 EDT Ventricular Rate:  73 PR Interval:    QRS Duration: 104 QT Interval:  396 QTC Calculation: 437 R Axis:   90 Text Interpretation: Sinus rhythm Left atrial enlargement Consider right ventricular hypertrophy Confirmed by Lennice Sites [phone]) on 02/18/2020 7:53:49 PM   Radiology No results found.  Procedures Procedures (including critical care time)  Medications Ordered in ED Medications  alum & mag hydroxide-simeth (MAALOX/MYLANTA) 200-200-20 MG/5ML suspension 30 mL (0 mLs Oral Hold 02/18/20 1923)    And  lidocaine (XYLOCAINE) 2 % viscous mouth solution 15 mL (0 mLs Oral Hold 02/18/20 1923)    ED Course  I have reviewed the triage vital signs and the nursing notes.  Pertinent labs & imaging results that were available during my care of the patient were reviewed by me and considered in my medical decision making (see chart for details).    MDM Rules/Calculators/A&P                          Jacob Estes is a 64 year old male with history of reflux who presents the ED with epigastric abdominal pain.  Normal vitals.  No fever.  Pain for the last hour.  Has had similar episode about 6 weeks ago that resolved on its own.  Appears to be associated with eating.  Does not have specific tenderness in the right upper  quadrant.  Suspect gastritis versus pancreatitis versus hepatobiliary process such as cholecystitis or gallstones.  Patient to be given GI cocktail.  EKG shows sinus rhythm.  No ischemic changes.  No chest pain and doubt ACS.  No significant anemia, electrolyte abnormality, kidney injury.  Gallbladder and liver enzymes within normal limits.  Lipase normal.  Doubt pancreatitis, doubt acute cholecystitis.  Mild elevation of white count likely secondary to nausea and pain.  Has no fever.  Overall no concern for bowel obstruction.  Patient states that pain is gone on reevaluation.  Suspect likely biliary colic versus gastritis.  Will add Carafate to go along with his omeprazole.  Recommend follow-up with primary care doctor for outpatient ultrasound of gallbladder and may need to also follow-up with GI.  Understands return precautions and discharged in the ED in good condition.  This chart was dictated using voice recognition software.  Despite best efforts to proofread,  errors can occur which can change the documentation meaning.    Final Clinical Impression(s) / ED Diagnoses Final diagnoses:  Epigastric pain    Rx / DC Orders ED Discharge Orders         Ordered    sucralfate (CARAFATE) 1 g tablet  3 times daily with meals & bedtime        02/18/20 2004           Lennice Sites, DO 02/18/20 2006

## 2020-02-18 NOTE — Discharge Instructions (Signed)
Discussed with your primary care doctor about getting an ultrasound of gallbladder.  Recommend also may be following up with your GI doctor to further discuss reflux care.  Please return to the ED if you develop worsening pain, fever, chills.

## 2020-02-18 NOTE — ED Triage Notes (Signed)
Pt reports sharp upper abd pain x 1 hour with nausea and "bloating". Similar episode ~6 weeks ago that resolved without medical treatment

## 2020-02-18 NOTE — ED Notes (Signed)
Pt denies pain at this time and would like to hold medication ordered.

## 2020-02-20 ENCOUNTER — Encounter: Payer: Self-pay | Admitting: Family Medicine

## 2020-02-20 DIAGNOSIS — R1013 Epigastric pain: Secondary | ICD-10-CM

## 2020-02-22 ENCOUNTER — Encounter: Payer: Self-pay | Admitting: Family Medicine

## 2020-02-27 ENCOUNTER — Other Ambulatory Visit: Payer: Self-pay

## 2020-02-27 ENCOUNTER — Ambulatory Visit (HOSPITAL_BASED_OUTPATIENT_CLINIC_OR_DEPARTMENT_OTHER)
Admission: RE | Admit: 2020-02-27 | Discharge: 2020-02-27 | Disposition: A | Discharge status: Home / Self Care | Payer: Medicare Other | Source: Ambulatory Visit | Attending: Family Medicine | Admitting: Family Medicine

## 2020-02-27 DIAGNOSIS — K7689 Other specified diseases of liver: Secondary | ICD-10-CM | POA: Diagnosis not present

## 2020-02-27 DIAGNOSIS — R1013 Epigastric pain: Secondary | ICD-10-CM | POA: Diagnosis not present

## 2020-02-28 ENCOUNTER — Encounter: Payer: Self-pay | Admitting: Family Medicine

## 2020-02-28 DIAGNOSIS — K7689 Other specified diseases of liver: Secondary | ICD-10-CM

## 2020-05-29 ENCOUNTER — Telehealth: Payer: Medicare Other | Admitting: Physician Assistant

## 2020-05-29 ENCOUNTER — Encounter: Payer: Self-pay | Admitting: Family Medicine

## 2020-05-29 DIAGNOSIS — J019 Acute sinusitis, unspecified: Secondary | ICD-10-CM

## 2020-05-29 DIAGNOSIS — B9689 Other specified bacterial agents as the cause of diseases classified elsewhere: Secondary | ICD-10-CM

## 2020-05-29 MED ORDER — AMOXICILLIN-POT CLAVULANATE 875-125 MG PO TABS: 1.0000 | ORAL_TABLET | Freq: Two times a day (BID) | ORAL | 0 refills | Status: DC

## 2020-05-29 NOTE — Progress Notes (Signed)
I have spent 5 minutes in review of e-visit questionnaire, review and updating patient chart, medical decision making and response to patient.   Kenitha Glendinning Cody Fatih Stalvey, PA-C    

## 2020-05-29 NOTE — Addendum Note (Signed)
Addended by: Brunetta Jeans on: 05/29/2020 05:25 PM   Modules accepted: Orders

## 2020-05-29 NOTE — Progress Notes (Signed)

## 2020-06-06 ENCOUNTER — Other Ambulatory Visit: Payer: Self-pay | Admitting: Family Medicine

## 2020-06-06 DIAGNOSIS — R3915 Urgency of urination: Secondary | ICD-10-CM

## 2020-10-03 ENCOUNTER — Other Ambulatory Visit: Payer: Self-pay

## 2020-10-03 ENCOUNTER — Ambulatory Visit (HOSPITAL_BASED_OUTPATIENT_CLINIC_OR_DEPARTMENT_OTHER)
Admission: RE | Admit: 2020-10-03 | Discharge: 2020-10-03 | Disposition: A | Discharge status: Home / Self Care | Payer: Medicare Other | Source: Ambulatory Visit | Attending: Family Medicine | Admitting: Family Medicine

## 2020-10-03 ENCOUNTER — Encounter: Payer: Self-pay | Admitting: Family Medicine

## 2020-10-03 DIAGNOSIS — K7689 Other specified diseases of liver: Secondary | ICD-10-CM | POA: Diagnosis not present

## 2020-10-03 DIAGNOSIS — K8020 Calculus of gallbladder without cholecystitis without obstruction: Secondary | ICD-10-CM | POA: Diagnosis not present

## 2021-04-08 DIAGNOSIS — D485 Neoplasm of uncertain behavior of skin: Secondary | ICD-10-CM | POA: Diagnosis not present

## 2021-04-08 DIAGNOSIS — L603 Nail dystrophy: Secondary | ICD-10-CM | POA: Diagnosis not present

## 2021-04-08 DIAGNOSIS — L720 Epidermal cyst: Secondary | ICD-10-CM | POA: Diagnosis not present

## 2021-04-08 DIAGNOSIS — L820 Inflamed seborrheic keratosis: Secondary | ICD-10-CM | POA: Diagnosis not present

## 2021-04-08 DIAGNOSIS — L821 Other seborrheic keratosis: Secondary | ICD-10-CM | POA: Diagnosis not present

## 2021-04-08 DIAGNOSIS — Z85828 Personal history of other malignant neoplasm of skin: Secondary | ICD-10-CM | POA: Diagnosis not present

## 2021-04-08 DIAGNOSIS — L2089 Other atopic dermatitis: Secondary | ICD-10-CM | POA: Diagnosis not present

## 2021-06-28 NOTE — Progress Notes (Addendum)
Therapist, music at [facility] ?[address], [address] ?[place], [place] [address] ?336 [phone] ?Fax [phone] ? ?Date:  07/01/2021  ? ?Name:  Jacob Estes   DOB:  1955-10-31   MRN:  [id number] ? ?PCP:  Darreld Mclean, MD  ? ? ?Chief Complaint: Annual Exam (Concerns/ questions: back issues/Zoster:never/PNA: never) ? ? ?History of Present Illness: ? ?Jacob Estes is a 66 y.o. very pleasant male patient who presents with the following: ? ?Patient seen today for physical exam ?Most recent visit with myself October 2021 ?Generally in good health except for history of hernia repair and GERD, occasional panic attack ? 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  He was also becoming more involved with his aging parents ?He has since retired ?His parents are both living- his dad is doing great , his father not quite as well ?He has 2 more grands on the way  ? ?Can suggest a COVID-19 booster ?Shingrix- he plans to do at his pharmacy ?Can give Pneumovax- will give today  ?Update labs today- he did just eat lunch - a biscuit  ? ?He enjoys lifting weights and doing cardio for exercise ?He enjoys working on his boat  ?He is trying to do some foam rolling for stiffness  ? ?He has noted right lower back pain for several years- he can make it go away with if he sort of rounds his back  ?He has more pain with standing for longer periods of time ?The pain does not go down his leg ? ?Pt also notes he had spirometry done a few year ago at [facility] and was told he had COPD- however he has never smoked.  He does have a very tall, thin body habitus and pectus excavatum which is likely the cause of these findings  ? ?There are no problems to display for this patient. ? ? ?No past medical history on file. ? ?Past Surgical History:  ?Procedure Laterality Date  ? HERNIA REPAIR    ? ? ?Social History  ? ?Tobacco Use  ? Smoking status: Never  ? Smokeless  tobacco: Never  ?Vaping Use  ? Vaping Use: Never used  ?Substance Use Topics  ? Alcohol use: Yes  ?  Comment: very very little  ? Drug use: Never  ? ? ?No family history on file. ? ?Allergies  ?Allergen Reactions  ? Codeine Hives and Other (See Comments)  ?  Heart problems ?Heart problems ?  ? ? ?Medication list has been reviewed and updated. ? ?Current Outpatient Medications on File Prior to Visit  ?Medication Sig Dispense Refill  ? diphenhydramine-acetaminophen (TYLENOL PM) 25-500 MG TABS tablet Take 1 tablet by mouth at bedtime as needed.    ? omeprazole (PRILOSEC) 40 MG capsule Take 1 capsule (40 mg total) by mouth daily. Use as needed for GERD sx 90 capsule 3  ? sucralfate (CARAFATE) 1 g tablet Take 1 tablet (1 g total) by mouth 4 (four) times daily -  with meals and at bedtime for 14 days. 56 tablet 0  ? ?No current facility-administered medications on file prior to visit.  ? ? ?Review of Systems: ? ?As per HPI- otherwise negative. ? ? ?Physical Examination: ?Vitals:  ? 07/01/21 1316  ?BP: 118/70  ?Pulse: 62  ?Resp: 18  ?Temp: 97.6 ?F (36.4 ?C)  ?SpO2: 97%  ? ?Vitals:  ? 07/01/21 1316  ?Weight: 186  lb (84.4 kg)  ? ?Body mass index is 23.88 kg/m?. ?Ideal Body Weight:   ? ?GEN: no acute distress. Normal weight, looks well  ?Tall and thin build ?HEENT: Atraumatic, Normocephalic.  ?Ears and Nose: No external deformity. ?CV: RRR, No M/G/R. No JVD. No thrill. No extra heart sounds. ?PULM: CTA B, no wheezes, crackles, rhonchi. No retractions. No resp. distress. No accessory muscle use. ?ABD: S, NT, ND, +BS. No rebound. No HSM. ?EXTR: No c/c/e ?PSYCH: Normally interactive. Conversant.  ?Pt notes mild tenderness to palpation over right SI joint  ? ?Assessment and Plan: ?Physical exam ? ?Screening for diabetes mellitus - Plan: Comprehensive metabolic panel, Hemoglobin A1c ? ?Screening for prostate cancer - Plan: PSA ? ?Screening for deficiency anemia - Plan: CBC ? ?Screening for hyperlipidemia - Plan: Lipid  panel ? ?Immunization due - Plan: Pneumococcal polysaccharide vaccine 23-valent greater than or equal to 2yo subcutaneous/IM ? ?Chronic bilateral low back pain without sciatica - Plan: DG Lumbar Spine Complete, DG Si Joints ? ?Physical exam today.  Encouraged healthy diet and exercise routine ?Suspect he may have some SI joint degenerative change- will obtain films today  ?Pneumovax given ?Follow-up pending labs ?Signed ?Lamar Blinks, MD ? ?Received his films, message to pt ? ?DG Lumbar Spine Complete ? ?Result Date: 07/01/2021 ?CLINICAL DATA:  Lower back and right SI joint pain for years EXAM: BILATERAL SACROILIAC JOINTS - 3+ VIEW; LUMBAR SPINE - COMPLETE 4+ VIEW COMPARISON:  None. FINDINGS: Sacroiliac joints: Minimal spurring of the sacroiliac joints. Soft tissues are unremarkable. Lumbar spine: Minimal anterolisthesis of L4 on L5. Vertebral body and disc heights are maintained. Minimal degenerative changes of the facets at L4-L5 and L5-S1. IMPRESSION: 1. Minimal bilateral sacroiliac joint osteoarthrosis. 2. Minimal degenerative changes of the lower lumbar spine. Electronically Signed   By: Miachel Roux M.D.   On: 07/01/2021 14:28  ? ?DG Si Joints ? ?Result Date: 07/01/2021 ?CLINICAL DATA:  Lower back and right SI joint pain for years EXAM: BILATERAL SACROILIAC JOINTS - 3+ VIEW; LUMBAR SPINE - COMPLETE 4+ VIEW COMPARISON:  None. FINDINGS: Sacroiliac joints: Minimal spurring of the sacroiliac joints. Soft tissues are unremarkable. Lumbar spine: Minimal anterolisthesis of L4 on L5. Vertebral body and disc heights are maintained. Minimal degenerative changes of the facets at L4-L5 and L5-S1. IMPRESSION: 1. Minimal bilateral sacroiliac joint osteoarthrosis. 2. Minimal degenerative changes of the lower lumbar spine. Electronically Signed   By: Miachel Roux M.D.   On: 07/01/2021 14:28   ? ?Received his labs, message to pt ? ?Results for orders placed or performed in visit on 07/01/21  ?CBC  ?Result Value Ref Range  ?  WBC 7.0 4.0 - 10.5 K/uL  ? RBC 5.29 4.22 - 5.81 Mil/uL  ? Platelets 207.0 150.0 - 400.0 K/uL  ? Hemoglobin 15.7 13.0 - 17.0 g/dL  ? HCT 47.0 39.0 - 52.0 %  ? MCV 88.8 78.0 - 100.0 fl  ? MCHC 33.5 30.0 - 36.0 g/dL  ? RDW 14.0 11.5 - 15.5 %  ?Comprehensive metabolic panel  ?Result Value Ref Range  ? Sodium 138 135 - 145 mEq/L  ? Potassium 4.4 3.5 - 5.1 mEq/L  ? Chloride 102 96 - 112 mEq/L  ? CO2 27 19 - 32 mEq/L  ? Glucose, Bld 87 70 - 99 mg/dL  ? BUN 18 6 - 23 mg/dL  ? Creatinine, Ser 1.15 0.40 - 1.50 mg/dL  ? Total Bilirubin 0.6 0.2 - 1.2 mg/dL  ? Alkaline Phosphatase 32 (L) 39 - 117  U/L  ? AST 18 0 - 37 U/L  ? ALT 15 0 - 53 U/L  ? Total Protein 6.6 6.0 - 8.3 g/dL  ? Albumin 4.5 3.5 - 5.2 g/dL  ? GFR 65.93 >60.00 mL/min  ? Calcium 9.7 8.4 - 10.5 mg/dL  ?Hemoglobin A1c  ?Result Value Ref Range  ? Hgb A1c MFr Bld 5.6 4.6 - 6.5 %  ?Lipid panel  ?Result Value Ref Range  ? Cholesterol 198 0 - 200 mg/dL  ? Triglycerides 166.0 (H) 0.0 - 149.0 mg/dL  ? HDL 58.10 >39.00 mg/dL  ? VLDL 33.2 0.0 - 40.0 mg/dL  ? LDL Cholesterol 106 (H) 0 - 99 mg/dL  ? Total CHOL/HDL Ratio 3   ? NonHDL 139.48   ?PSA  ?Result Value Ref Range  ? PSA 1.37 0.10 - 4.00 ng/mL  ? ? ?

## 2021-06-28 NOTE — Patient Instructions (Addendum)
It was very nice to see you again today, I will be in touch with your labs and x-rays aap ?Ok to get Shingrix series at your drug store at your convenience ?Pneumonia vaccine given today  ?

## 2021-07-01 ENCOUNTER — Encounter: Payer: Self-pay | Admitting: Family Medicine

## 2021-07-01 ENCOUNTER — Ambulatory Visit (HOSPITAL_BASED_OUTPATIENT_CLINIC_OR_DEPARTMENT_OTHER)
Admission: RE | Admit: 2021-07-01 | Discharge: 2021-07-01 | Disposition: A | Discharge status: Home / Self Care | Payer: Medicare Other | Source: Ambulatory Visit | Attending: Family Medicine | Admitting: Family Medicine

## 2021-07-01 ENCOUNTER — Ambulatory Visit (INDEPENDENT_AMBULATORY_CARE_PROVIDER_SITE_OTHER): Payer: Medicare Other | Admitting: Family Medicine

## 2021-07-01 ENCOUNTER — Other Ambulatory Visit: Payer: Self-pay

## 2021-07-01 VITALS — BP 118/70 | HR 62 | Temp 97.6°F | Resp 18 | Wt 186.0 lb

## 2021-07-01 DIAGNOSIS — M545 Low back pain, unspecified: Secondary | ICD-10-CM | POA: Diagnosis not present

## 2021-07-01 DIAGNOSIS — G8929 Other chronic pain: Secondary | ICD-10-CM | POA: Diagnosis not present

## 2021-07-01 DIAGNOSIS — Z130 Encounter for screening for diseases of the blood and blood-forming organs and certain disorders involving the immune mechanism: Secondary | ICD-10-CM | POA: Diagnosis not present

## 2021-07-01 DIAGNOSIS — M47817 Spondylosis without myelopathy or radiculopathy, lumbosacral region: Secondary | ICD-10-CM | POA: Diagnosis not present

## 2021-07-01 DIAGNOSIS — Z131 Encounter for screening for diabetes mellitus: Secondary | ICD-10-CM | POA: Diagnosis not present

## 2021-07-01 DIAGNOSIS — Z23 Encounter for immunization: Secondary | ICD-10-CM

## 2021-07-01 DIAGNOSIS — Z0000 Encounter for general adult medical examination without abnormal findings: Secondary | ICD-10-CM

## 2021-07-01 DIAGNOSIS — R972 Elevated prostate specific antigen [PSA]: Secondary | ICD-10-CM

## 2021-07-01 DIAGNOSIS — M533 Sacrococcygeal disorders, not elsewhere classified: Secondary | ICD-10-CM | POA: Diagnosis not present

## 2021-07-01 DIAGNOSIS — Z125 Encounter for screening for malignant neoplasm of prostate: Secondary | ICD-10-CM | POA: Diagnosis not present

## 2021-07-01 DIAGNOSIS — Z13220 Encounter for screening for lipoid disorders: Secondary | ICD-10-CM

## 2021-07-01 LAB — COMPREHENSIVE METABOLIC PANEL
ALT: 15 U/L (ref 0–53)
AST: 18 U/L (ref 0–37)
Albumin: 4.5 g/dL (ref 3.5–5.2)
Alkaline Phosphatase: 32 U/L — ABNORMAL LOW (ref 39–117)
BUN: 18 mg/dL (ref 6–23)
CO2: 27 mEq/L (ref 19–32)
Calcium: 9.7 mg/dL (ref 8.4–10.5)
Chloride: 102 mEq/L (ref 96–112)
Creatinine, Ser: 1.15 mg/dL (ref 0.40–1.50)
GFR: 65.93 mL/min (ref >60.00)
Glucose, Bld: 87 mg/dL (ref 70–99)
Potassium: 4.4 mEq/L (ref 3.5–5.1)
Sodium: 138 mEq/L (ref 135–145)
Total Bilirubin: 0.6 mg/dL (ref 0.2–1.2)
Total Protein: 6.6 g/dL (ref 6.0–8.3)

## 2021-07-01 LAB — CBC
HCT: 47 % (ref 39.0–52.0)
Hemoglobin: 15.7 g/dL (ref 13.0–17.0)
MCHC: 33.5 g/dL (ref 30.0–36.0)
MCV: 88.8 fl (ref 78.0–100.0)
Platelets: 207 10*3/uL (ref 150.0–400.0)
RBC: 5.29 Mil/uL (ref 4.22–5.81)
RDW: 14 % (ref 11.5–15.5)
WBC: 7 10*3/uL (ref 4.0–10.5)

## 2021-07-01 LAB — LIPID PANEL
Cholesterol: 198 mg/dL (ref 0–200)
HDL: 58.1 mg/dL (ref >39.00)
LDL Cholesterol: 106 mg/dL — ABNORMAL HIGH (ref 0–99)
NonHDL: 139.48
Total CHOL/HDL Ratio: 3
Triglycerides: 166 mg/dL — ABNORMAL HIGH (ref 0.0–149.0)
VLDL: 33.2 mg/dL (ref 0.0–40.0)

## 2021-07-01 LAB — HEMOGLOBIN A1C: Hgb A1c MFr Bld: 5.6 % (ref 4.6–6.5)

## 2021-07-01 LAB — PSA: PSA: 1.37 ng/mL (ref 0.10–4.00)

## 2021-07-01 NOTE — Addendum Note (Signed)
Addended by: Lamar Blinks C on: 07/01/2021 08:23 PM ? ? Modules accepted: Orders ? ?

## 2021-08-20 ENCOUNTER — Telehealth: Payer: Self-pay | Admitting: Family Medicine

## 2021-08-20 NOTE — Telephone Encounter (Signed)
Left message for patient to call back and schedule Medicare Annual Wellness Visit (AWV) either virtually or phone ? ?AWVI DUE  02/20/2020 PER [facility] ? please schedule at anytime with health coach 2 ? ?This should be a 45 minute visit.  ? ?I left my direct # [phone] ?

## 2021-08-23 ENCOUNTER — Ambulatory Visit (INDEPENDENT_AMBULATORY_CARE_PROVIDER_SITE_OTHER): Payer: Medicare Other

## 2021-08-23 VITALS — Ht 74.0 in | Wt 185.0 lb

## 2021-08-23 DIAGNOSIS — Z0000 Encounter for general adult medical examination without abnormal findings: Secondary | ICD-10-CM

## 2021-08-23 NOTE — Patient Instructions (Signed)
Jacob Estes , ?Thank you for taking time to come for your Medicare Wellness Visit. I appreciate your ongoing commitment to your health goals. Please review the following plan we discussed and let me know if I can assist you in the future.  ? ?Screening recommendations/referrals: ?Colonoscopy: completed 04/05/2019, due 04/04/2029 ?Recommended yearly ophthalmology/optometry visit for glaucoma screening and checkup ?Recommended yearly dental visit for hygiene and checkup ? ?Vaccinations: ?Influenza vaccine: due 11/19/2021 ?Pneumococcal vaccine: completed 07/01/2021 ?Tdap vaccine: completed 07/22/2012, due 07/23/2022 ?Shingles vaccine: needs second dose   ?Covid-19:  01/31/2021, 11/07/2019, 06/03/2019 ? ?Advanced directives: Please bring a copy of your POA (Power of Attorney) and/or Living Will to your next appointment.  ? ?Conditions/risks identified: none ? ?Next appointment: Follow up in one year for your annual wellness visit.  ? ?Preventive Care 68 Years and Older, Male ?Preventive care refers to lifestyle choices and visits with your health care provider that can promote health and wellness. ?What does preventive care include? ?A yearly physical exam. This is also called an annual well check. ?Dental exams once or twice a year. ?Routine eye exams. Ask your health care provider how often you should have your eyes checked. ?Personal lifestyle choices, including: ?Daily care of your teeth and gums. ?Regular physical activity. ?Eating a healthy diet. ?Avoiding tobacco and drug use. ?Limiting alcohol use. ?Practicing safe sex. ?Taking low doses of aspirin every day. ?Taking vitamin and mineral supplements as recommended by your health care provider. ?What happens during an annual well check? ?The services and screenings done by your health care provider during your annual well check will depend on your age, overall health, lifestyle risk factors, and family history of disease. ?Counseling  ?Your health care provider may ask you  questions about your: ?Alcohol use. ?Tobacco use. ?Drug use. ?Emotional well-being. ?Home and relationship well-being. ?Sexual activity. ?Eating habits. ?History of falls. ?Memory and ability to understand (cognition). ?Work and work Statistician. ?Screening  ?You may have the following tests or measurements: ?Height, weight, and BMI. ?Blood pressure. ?Lipid and cholesterol levels. These may be checked every 5 years, or more frequently if you are over 42 years old. ?Skin check. ?Lung cancer screening. You may have this screening every year starting at age 72 if you have a 30-pack-year history of smoking and currently smoke or have quit within the past 15 years. ?Fecal occult blood test (FOBT) of the stool. You may have this test every year starting at age 37. ?Flexible sigmoidoscopy or colonoscopy. You may have a sigmoidoscopy every 5 years or a colonoscopy every 10 years starting at age 53. ?Prostate cancer screening. Recommendations will vary depending on your family history and other risks. ?Hepatitis C blood test. ?Hepatitis B blood test. ?Sexually transmitted disease (STD) testing. ?Diabetes screening. This is done by checking your blood sugar (glucose) after you have not eaten for a while (fasting). You may have this done every 1-3 years. ?Abdominal aortic aneurysm (AAA) screening. You may need this if you are a current or former smoker. ?Osteoporosis. You may be screened starting at age 70 if you are at high risk. ?Talk with your health care provider about your test results, treatment options, and if necessary, the need for more tests. ?Vaccines  ?Your health care provider may recommend certain vaccines, such as: ?Influenza vaccine. This is recommended every year. ?Tetanus, diphtheria, and acellular pertussis (Tdap, Td) vaccine. You may need a Td booster every 10 years. ?Zoster vaccine. You may need this after age 81. ?Pneumococcal 13-valent conjugate (PCV13) vaccine.  One dose is recommended after age  35. ?Pneumococcal polysaccharide (PPSV23) vaccine. One dose is recommended after age 26. ?Talk to your health care provider about which screenings and vaccines you need and how often you need them. ?This information is not intended to replace advice given to you by your health care provider. Make sure you discuss any questions you have with your health care provider. ?Document Released: 05/04/2015 Document Revised: 12/26/2015 Document Reviewed: 02/06/2015 ?Elsevier Interactive Patient Education ? 2017 [employer]. ? ?Fall Prevention in the Home ?Falls can cause injuries. They can happen to people of all ages. There are many things you can do to make your home safe and to help prevent falls. ?What can I do on the outside of my home? ?Regularly fix the edges of walkways and driveways and fix any cracks. ?Remove anything that might make you trip as you walk through a door, such as a raised step or threshold. ?Trim any bushes or trees on the path to your home. ?Use bright outdoor lighting. ?Clear any walking paths of anything that might make someone trip, such as rocks or tools. ?Regularly check to see if handrails are loose or broken. Make sure that both sides of any steps have handrails. ?Any raised decks and porches should have guardrails on the edges. ?Have any leaves, snow, or ice cleared regularly. ?Use sand or salt on walking paths during winter. ?Clean up any spills in your garage right away. This includes oil or grease spills. ?What can I do in the bathroom? ?Use night lights. ?Install grab bars by the toilet and in the tub and shower. Do not use towel bars as grab bars. ?Use non-skid mats or decals in the tub or shower. ?If you need to sit down in the shower, use a plastic, non-slip stool. ?Keep the floor dry. Clean up any water that spills on the floor as soon as it happens. ?Remove soap buildup in the tub or shower regularly. ?Attach bath mats securely with double-sided non-slip rug tape. ?Do not have throw  rugs and other things on the floor that can make you trip. ?What can I do in the bedroom? ?Use night lights. ?Make sure that you have a light by your bed that is easy to reach. ?Do not use any sheets or blankets that are too big for your bed. They should not hang down onto the floor. ?Have a firm chair that has side arms. You can use this for support while you get dressed. ?Do not have throw rugs and other things on the floor that can make you trip. ?What can I do in the kitchen? ?Clean up any spills right away. ?Avoid walking on wet floors. ?Keep items that you use a lot in easy-to-reach places. ?If you need to reach something above you, use a strong step stool that has a grab bar. ?Keep electrical cords out of the way. ?Do not use floor polish or wax that makes floors slippery. If you must use wax, use non-skid floor wax. ?Do not have throw rugs and other things on the floor that can make you trip. ?What can I do with my stairs? ?Do not leave any items on the stairs. ?Make sure that there are handrails on both sides of the stairs and use them. Fix handrails that are broken or loose. Make sure that handrails are as long as the stairways. ?Check any carpeting to make sure that it is firmly attached to the stairs. Fix any carpet that is loose or  worn. ?Avoid having throw rugs at the top or bottom of the stairs. If you do have throw rugs, attach them to the floor with carpet tape. ?Make sure that you have a light switch at the top of the stairs and the bottom of the stairs. If you do not have them, ask someone to add them for you. ?What else can I do to help prevent falls? ?Wear shoes that: ?Do not have high heels. ?Have rubber bottoms. ?Are comfortable and fit you well. ?Are closed at the toe. Do not wear sandals. ?If you use a stepladder: ?Make sure that it is fully opened. Do not climb a closed stepladder. ?Make sure that both sides of the stepladder are locked into place. ?Ask someone to hold it for you, if  possible. ?Clearly mark and make sure that you can see: ?Any grab bars or handrails. ?First and last steps. ?Where the edge of each step is. ?Use tools that help you move around (mobility aids) if they are needed

## 2021-08-23 NOTE — Progress Notes (Addendum)
?I connected with Jacob Estes today by telephone and verified that I am speaking with the correct person using two identifiers. ?Location patient: home ?Location provider: work ?Persons participating in the virtual visit: Jacob Gauss LPN. ?  ?I discussed the limitations, risks, security and privacy concerns of performing an evaluation and management service by telephone and the availability of in person appointments. I also discussed with the patient that there may be a patient responsible charge related to this service. The patient expressed understanding and verbally consented to this telephonic visit.  ?  ?Interactive audio and video telecommunications were attempted between this provider and patient, however failed, due to patient having technical difficulties OR patient did not have access to video capability.  We continued and completed visit with audio only. ? ?  ? ?Vital signs may be patient reported or missing. ? ?Subjective:  ? Jacob Estes is a 66 y.o. male who presents for an Initial Medicare Annual Wellness Visit. ? ?Review of Systems    ? ?Cardiac Risk Factors include: advanced age (>43mn, >>54women);male gender ? ?   ?Objective:  ?  ?Today's Vitals  ? 08/23/21 1011  ?Weight: 185 lb (83.9 kg)  ?Height: '6\' 2"'$  (1.88 m)  ? ?Body mass index is 23.75 kg/m?. ? ? ?  08/23/2021  ? 10:17 AM 02/18/2020  ?  6:32 PM  ?Advanced Directives  ?Does Patient Have a Medical Advance Directive? Yes Yes  ?Type of [place]   ?Does patient want to make changes to medical advance directive?  No - Patient declined  ?Copy of [place]? No - copy requested   ? ? ?Current Medications (verified) ?Outpatient Encounter Medications as of 08/23/2021  ?Medication Sig  ? diphenhydramine-acetaminophen (TYLENOL PM) 25-500 MG TABS tablet Take 1 tablet by mouth at bedtime as needed.  ? loratadine (CLARITIN) 10 MG tablet Take 10 mg by mouth  daily.  ? omeprazole (PRILOSEC) 40 MG capsule Take 1 capsule (40 mg total) by mouth daily. Use as needed for GERD sx  ? sucralfate (CARAFATE) 1 g tablet Take 1 tablet (1 g total) by mouth 4 (four) times daily -  with meals and at bedtime for 14 days.  ? ?No facility-administered encounter medications on file as of 08/23/2021.  ? ? ?Allergies (verified) ?Codeine  ? ?History: ?History reviewed. No pertinent past medical history. ?Past Surgical History:  ?Procedure Laterality Date  ? HERNIA REPAIR    ? ?History reviewed. No pertinent family history. ?Social History  ? ?Socioeconomic History  ? Marital status: Divorced  ?  Spouse name: Not on file  ? Number of children: Not on file  ? Years of education: Not on file  ? Highest education level: Not on file  ?Occupational History  ? Not on file  ?Tobacco Use  ? Smoking status: Never  ? Smokeless tobacco: Never  ?Vaping Use  ? Vaping Use: Never used  ?Substance and Sexual Activity  ? Alcohol use: Yes  ?  Comment: very very little  ? Drug use: Never  ? Sexual activity: Not on file  ?Other Topics Concern  ? Not on file  ?Social History Narrative  ? Not on file  ? ?Social Determinants of Health  ? ?Financial Resource Strain: Low Risk   ? Difficulty of Paying Living Expenses: Not hard at all  ?Food Insecurity: No Food Insecurity  ? Worried About RCharity fundraiserin the Last Year:  Never true  ? Ran Out of Food in the Last Year: Never true  ?Transportation Needs: No Transportation Needs  ? Lack of Transportation (Medical): No  ? Lack of Transportation (Non-Medical): No  ?Physical Activity: Sufficiently Active  ? Days of Exercise per Week: 5 days  ? Minutes of Exercise per Session: 90 min  ?Stress: No Stress Concern Present  ? Feeling of Stress : Not at all  ?Social Connections: Not on file  ? ? ?Tobacco Counseling ?Counseling given: Not Answered ? ? ?Clinical Intake: ? ?Pre-visit preparation completed: Yes ? ?Pain : No/denies pain ? ?  ? ?Nutritional Status: BMI of 19-24   Normal ?Nutritional Risks: None ?Diabetes: No ? ?How often do you need to have someone help you when you read instructions, pamphlets, or other written materials from your doctor or pharmacy?: 1 - Never ?What is the last grade level you completed in school?: college ? ?Diabetic? no ? ?Interpreter Needed?: No ? ?Information entered by :: NAllen LPN ? ? ?Activities of Daily Living ? ?  08/23/2021  ? 10:18 AM  ?In your present state of health, do you have any difficulty performing the following activities:  ?Hearing? 0  ?Vision? 0  ?Difficulty concentrating or making decisions? 0  ?Walking or climbing stairs? 0  ?Dressing or bathing? 0  ?Doing errands, shopping? 0  ?Preparing Food and eating ? N  ?Using the Toilet? N  ?In the past six months, have you accidently leaked urine? N  ?Do you have problems with loss of bowel control? N  ?Managing your Medications? N  ?Managing your Finances? N  ?Housekeeping or managing your Housekeeping? N  ? ? ?Patient Care Team: ?Jacob Estes, Jacob Filler, MD as PCP - General (Family Medicine) ? ?Indicate any recent Medical Services you may have received from other than [facility] providers in the past year (date may be approximate). ? ?   ?Assessment:  ? This is a routine wellness examination for [facility]. ? ?Hearing/Vision screen ?Vision Screening - Comments:: Regular eye exams, [facility] ? ?Dietary issues and exercise activities discussed: ?Current Exercise Habits: Home exercise routine, Type of exercise: strength training/weights;treadmill, Time (Minutes): > 60, Frequency (Times/Week): 5, Weekly Exercise (Minutes/Week): 0 ? ? Goals Addressed   ? ?  ?  ?  ?  ? This Visit's Progress  ?  Patient Stated     ?  08/23/2021, try to stretches to elevate back pain ?  ? ?  ? ?Depression Screen ? ?  08/23/2021  ? 10:18 AM 07/01/2021  ?  1:20 PM  ?PHQ 2/9 Scores  ?PHQ - 2 Score 0 0  ?  ?Fall Risk ? ?  08/23/2021  ? 10:18 AM 07/01/2021  ?  1:20 PM  ?Fall Risk   ?Falls in the past year? 0 0  ?Number falls in past  yr: 0 0  ?Injury with Fall? 0 0  ?Risk for fall due to : No Fall Risks   ?Follow up Falls evaluation completed;Education provided;Falls prevention discussed   ? ? ?FALL RISK PREVENTION PERTAINING TO THE HOME: ? ?Any stairs in or around the home? Yes  ?If so, are there any without handrails? No  ?Home free of loose throw rugs in walkways, pet beds, electrical cords, etc? Yes  ?Adequate lighting in your home to reduce risk of falls? Yes  ? ?ASSISTIVE DEVICES UTILIZED TO PREVENT FALLS: ? ?Life alert? No  ?Use of a cane, walker or w/c? No  ?Grab bars in the bathroom? No  ?  Shower chair or bench in shower? Yes  ?Elevated toilet seat or a handicapped toilet? Yes  ? ?TIMED UP AND GO: ? ?Was the test performed? No .  ? ? ? ? ?Cognitive Function: ?  ?  ? ?  08/23/2021  ? 10:19 AM  ?6CIT Screen  ?What Year? 0 points  ?What month? 0 points  ?What time? 0 points  ?Count back from 20 0 points  ?Months in reverse 0 points  ?Repeat phrase 0 points  ?Total Score 0 points  ? ? ?Immunizations ?Immunization History  ?Administered Date(s) Administered  ? Influenza,inj,Quad PF,6+ Mos 12/18/2016, 12/13/2018  ? MODERNA COVID-19 SARS-COV-2 PEDS BIVALENT BOOSTER 6Y-11Y 11/07/2019  ? Moderna Covid-19 Vaccine Bivalent Booster 38yr & up 01/31/2021  ? Pneumococcal Conjugate-13 02/23/2019  ? Pneumococcal Polysaccharide-23 07/01/2021  ? Tdap 07/22/2012  ? Zoster Recombinat (Shingrix) 07/31/2021  ? ? ?TDAP status: Up to date ? ?Flu Vaccine status: Up to date ? ?Pneumococcal vaccine status: Up to date ? ?Covid-19 vaccine status: Completed vaccines ? ?Qualifies for Shingles Vaccine? Yes   ?Zostavax completed No   ?Shingrix Completed?: needs second dose ? ?Screening Tests ?Health Maintenance  ?Topic Date Due  ? COVID-19 Vaccine (1) 01/31/2021  ? Zoster Vaccines- Shingrix (2 of 2) 09/25/2021  ? INFLUENZA VACCINE  11/19/2021  ? TETANUS/TDAP  07/23/2022  ? COLONOSCOPY (Pts 45-472yrInsurance coverage will need to be confirmed)  04/04/2029  ? Pneumonia  Vaccine 6568Years old  Completed  ? Hepatitis C Screening  Completed  ? HPV VACCINES  Aged Out  ? ? ?Health Maintenance ? ?Health Maintenance Due  ?Topic Date Due  ? COVID-19 Vaccine (1) 01/31/2021  ? ? ?Colore

## 2021-10-07 ENCOUNTER — Encounter: Payer: Self-pay | Admitting: Family Medicine

## 2021-10-07 ENCOUNTER — Other Ambulatory Visit (INDEPENDENT_AMBULATORY_CARE_PROVIDER_SITE_OTHER): Payer: Medicare Other

## 2021-10-07 DIAGNOSIS — R972 Elevated prostate specific antigen [PSA]: Secondary | ICD-10-CM | POA: Diagnosis not present

## 2021-10-07 LAB — PSA: PSA: 1.15 ng/mL (ref 0.10–4.00)

## 2022-04-30 DIAGNOSIS — X32XXXS Exposure to sunlight, sequela: Secondary | ICD-10-CM | POA: Diagnosis not present

## 2022-04-30 DIAGNOSIS — L821 Other seborrheic keratosis: Secondary | ICD-10-CM | POA: Diagnosis not present

## 2022-04-30 DIAGNOSIS — L814 Other melanin hyperpigmentation: Secondary | ICD-10-CM | POA: Diagnosis not present

## 2022-04-30 DIAGNOSIS — D225 Melanocytic nevi of trunk: Secondary | ICD-10-CM | POA: Diagnosis not present

## 2022-04-30 DIAGNOSIS — L820 Inflamed seborrheic keratosis: Secondary | ICD-10-CM | POA: Diagnosis not present

## 2022-05-19 DIAGNOSIS — D485 Neoplasm of uncertain behavior of skin: Secondary | ICD-10-CM | POA: Diagnosis not present

## 2022-05-19 DIAGNOSIS — L720 Epidermal cyst: Secondary | ICD-10-CM | POA: Diagnosis not present

## 2022-06-02 DIAGNOSIS — L720 Epidermal cyst: Secondary | ICD-10-CM | POA: Diagnosis not present

## 2022-06-04 IMAGING — US US ABDOMEN LIMITED
1 series · 14 of 25 positions shown · non-contrast
Comparison: None.

CLINICAL DATA: Epigastric pain

EXAM:
ULTRASOUND ABDOMEN LIMITED RIGHT UPPER QUADRANT

[Series 1: us abdomen limited · 45 acquisitions, 14 frames shown]
[im 1/45]
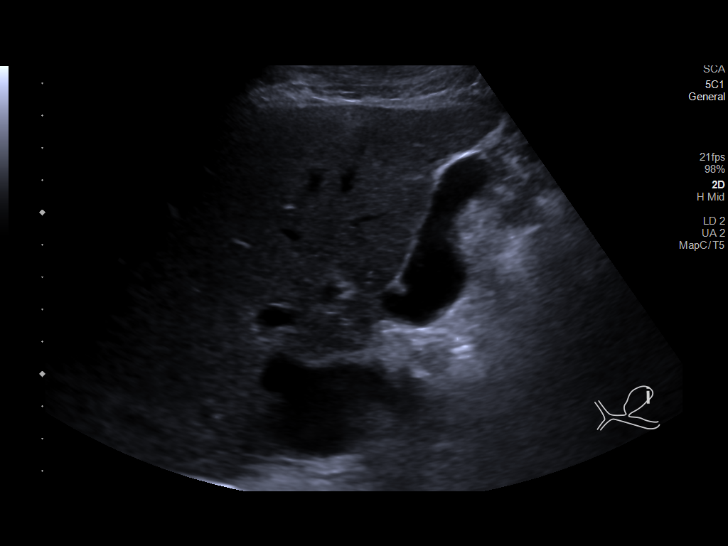
[im 4/45]
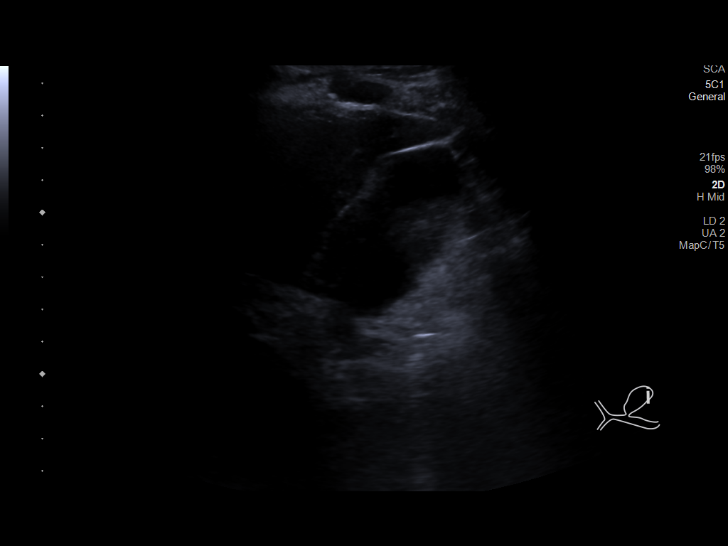
[im 8/45]
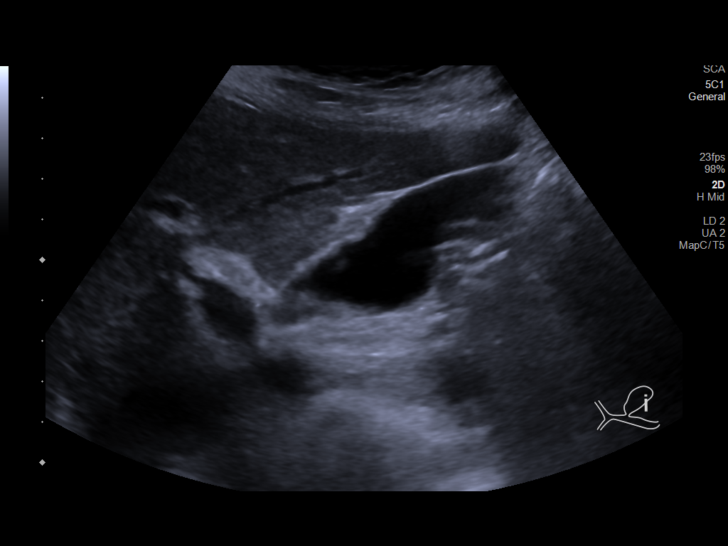
[im 12/45]
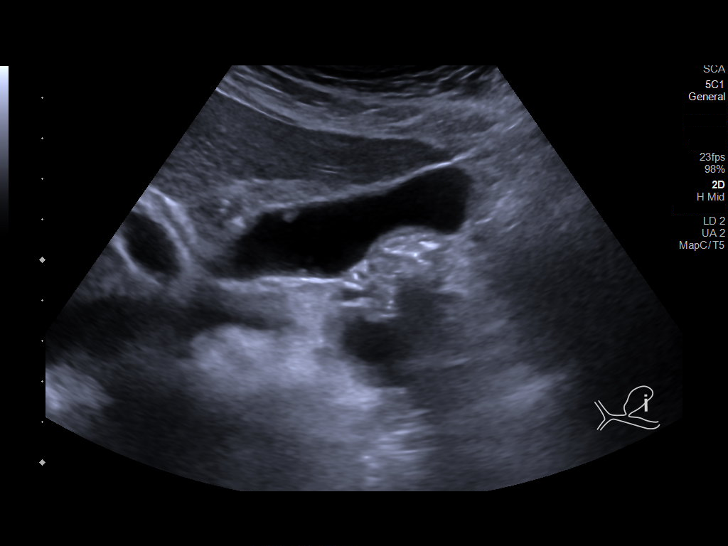
[im 15/45]
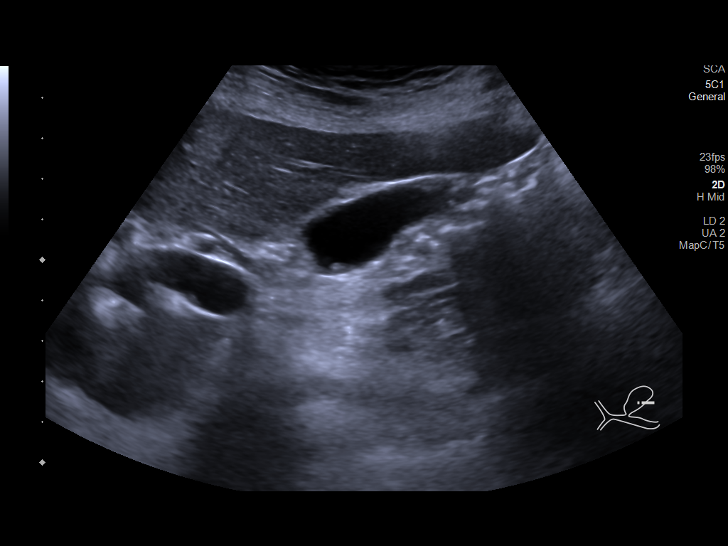
[im 17/45]
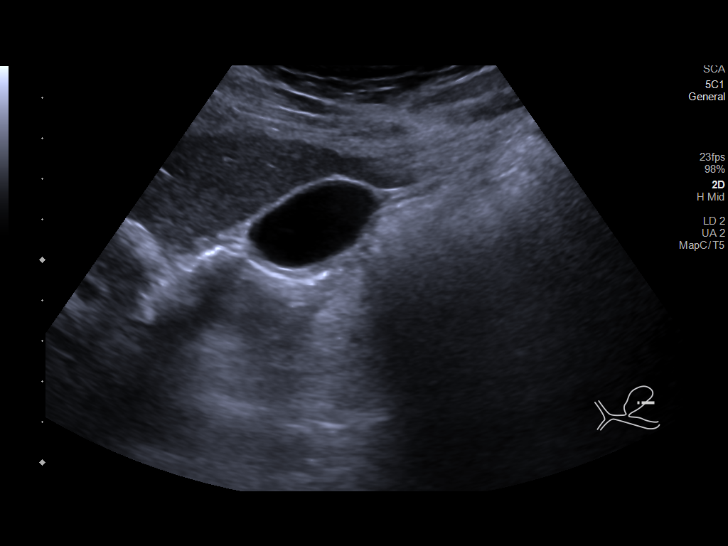
[im 21/45]
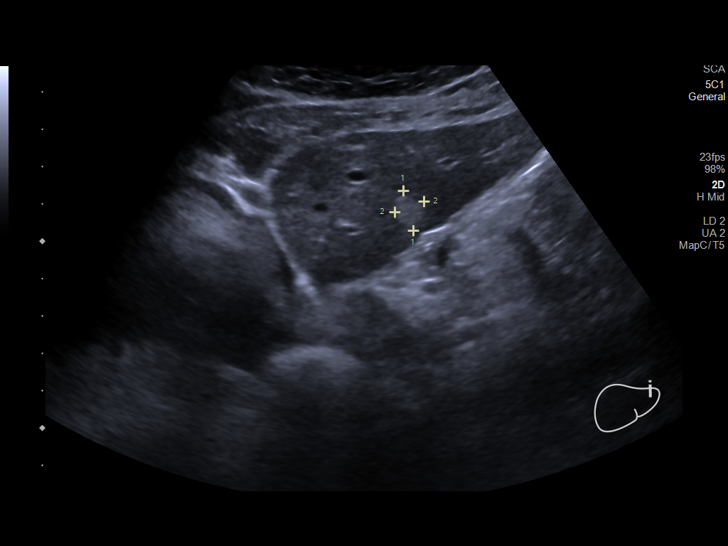
[im 24/45]
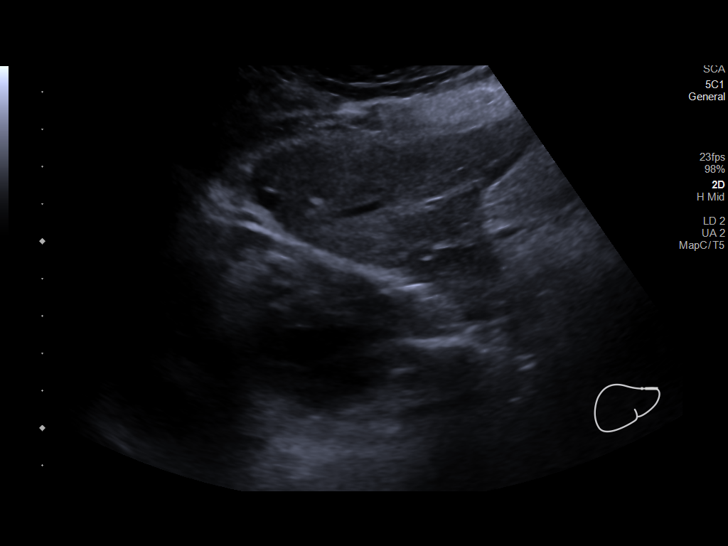
[im 28/45]
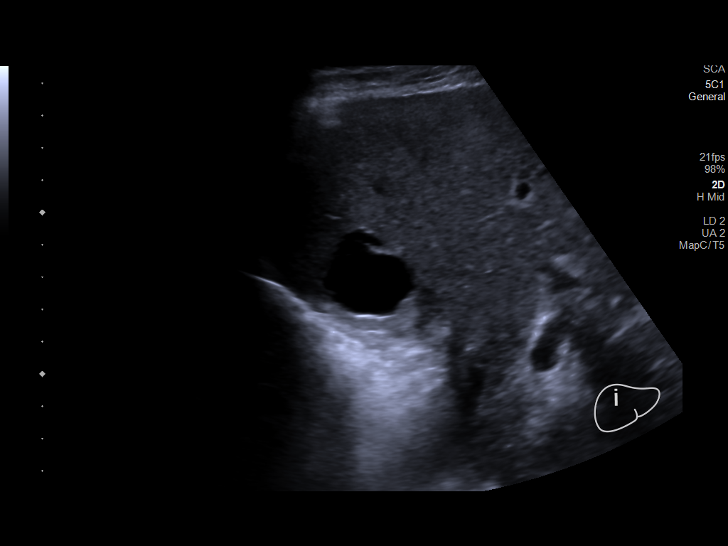
[im 30/45]
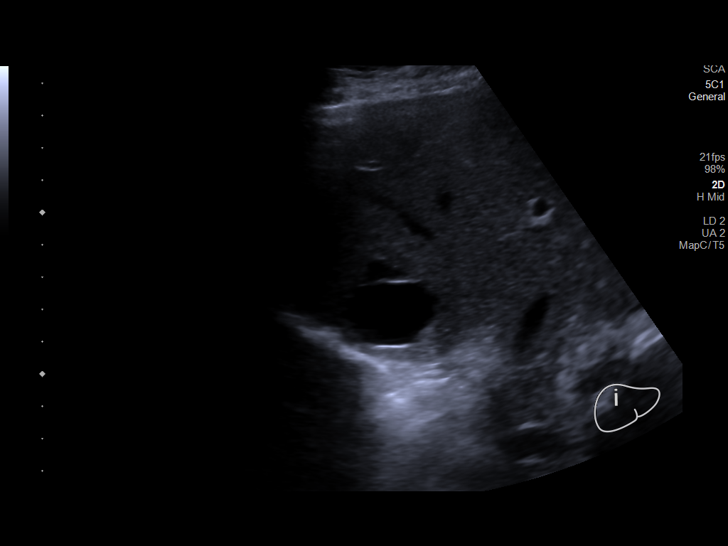
[im 34/45]
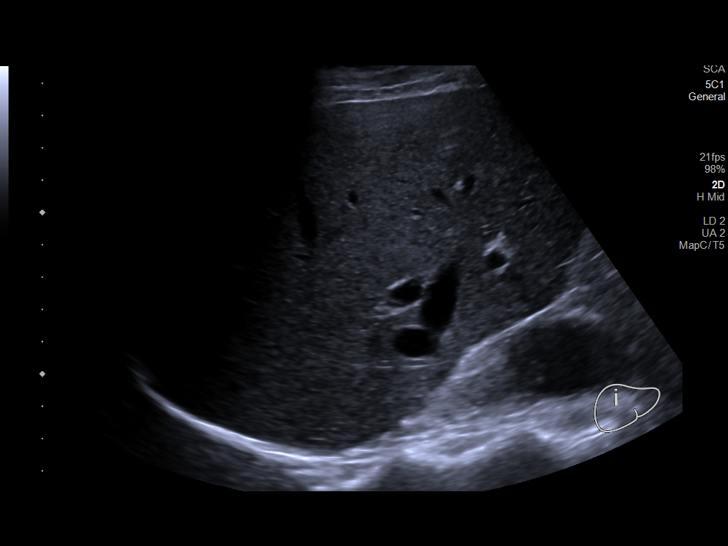
[im 37/45]
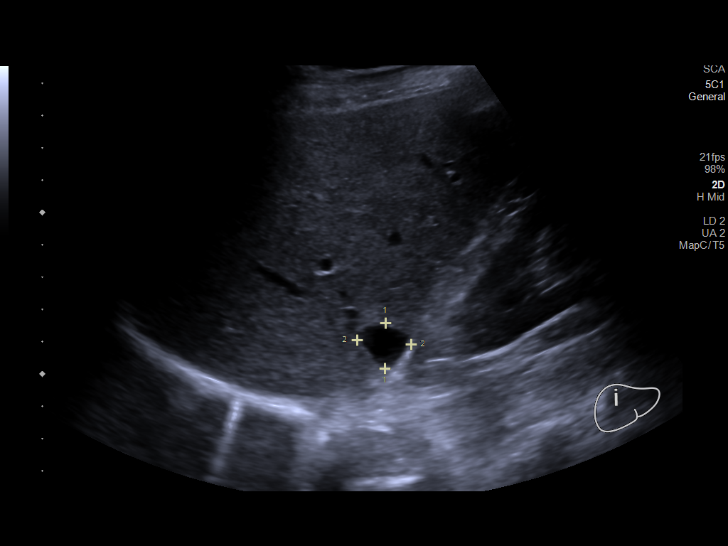
[im 41/45]
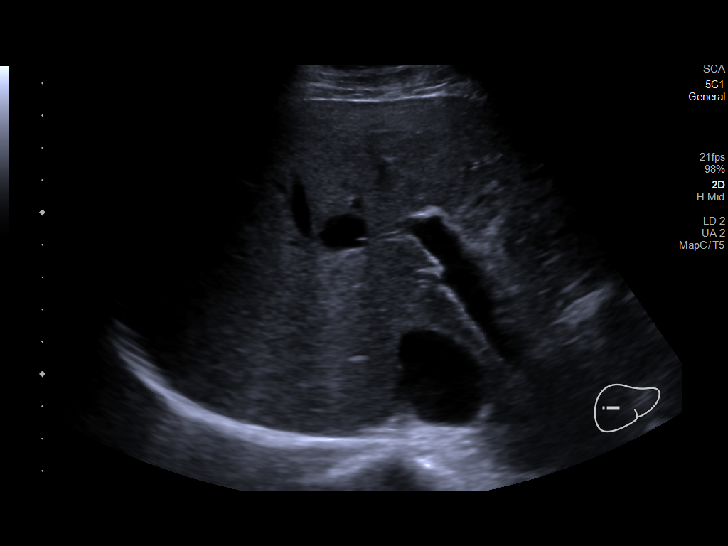
[im 45/45]
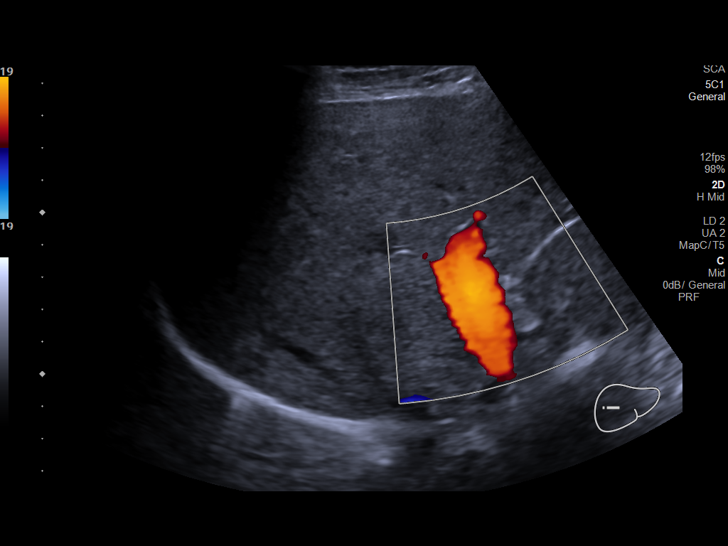

[14 of 25 positions shown; findings below may reference images not displayed]

FINDINGS: Gallbladder:

Gallbladder is well distended with 6 mm gallbladder polyp. No wall
thickening or pericholecystic fluid is noted.

Common bile duct:

Diameter: 2.9 mm.

Liver:

Hyperechoic focus is noted in the left lobe of the liver consistent
with a hemangioma measuring 11 mm. Cysts are noted scattered
throughout the right lobe which appear predominately simple in
nature. One demonstrates a single septation. Portal vein is patent
on color Doppler imaging with normal direction of blood flow towards
the liver.

Other: None.
IMPRESSION: Gallbladder polyp without complicating factors.

Hepatic hemangioma.

Multiple hepatic cysts, one which demonstrates some internal
septations. Short-term follow-up could be performed in 6 months to
assess for stability.

## 2022-08-14 ENCOUNTER — Telehealth: Payer: Self-pay | Admitting: Family Medicine

## 2022-08-14 NOTE — Telephone Encounter (Signed)
Copied from CRM [id number]. Topic: Medicare AWV >> Aug 14, 2022 11:13 AM Payton Doughty wrote: Reason for CRM: Called patient to schedule Medicare Annual Wellness Visit (AWV). Left message for patient to call back and schedule Medicare Annual Wellness Visit (AWV).  Last date of AWV: 08/23/21  Please schedule an appointment at any time with Donne Anon, CMA  .  If any questions, please contact me.  Thank you ,  Verlee Rossetti; Care Guide Ambulatory Clinical Support [facility] Direct Dial: [phone]

## 2022-09-15 DIAGNOSIS — K219 Gastro-esophageal reflux disease without esophagitis: Secondary | ICD-10-CM | POA: Insufficient documentation

## 2022-09-15 NOTE — Patient Instructions (Incomplete)
It was good to see you today- I will be in touch with your labs asap!  ?

## 2022-09-15 NOTE — Progress Notes (Unsigned)
[facility] [address] [place], [place] [address] [phone]  Date:  09/17/2022   Name:  Jacob Estes   DOB:  04-14-1956   MRN:  [id number]  PCP:  Pearline Cables, MD    Chief Complaint: No chief complaint on file.   History of Present Illness:  Jacob Estes is a 67 y.o. very pleasant male patient who presents with the following:  Seen today for CPE- Generally in good health except for history of hernia repair and GERD, occasional panic attack   Last visit with myself 3/23-  Retired from his work in [employer]. 2 children and 2 grands, involved in caring for his aging parents   Enjoys exercise with cardio and strength  Covid booster recommended Shingrix 2nd dose Tetanus can be updated  Labs- one year ago  Colon 2020 There are no problems to display for this patient.   No past medical history on file.  Past Surgical History:  Procedure Laterality Date   HERNIA REPAIR      Social History   Tobacco Use   Smoking status: Never   Smokeless tobacco: Never  Vaping Use   Vaping Use: Never used  Substance Use Topics   Alcohol use: Yes    Comment: very very little   Drug use: Never    No family history on file.  Allergies  Allergen Reactions   Codeine Hives and Other (See Comments)    Heart problems Heart problems     Medication list has been reviewed and updated.  Current Outpatient Medications on File Prior to Visit  Medication Sig Dispense Refill   diphenhydramine-acetaminophen (TYLENOL PM) 25-500 MG TABS tablet Take 1 tablet by mouth at bedtime as needed.     loratadine (CLARITIN) 10 MG tablet Take 10 mg by mouth daily.     omeprazole (PRILOSEC) 40 MG capsule Take 1 capsule (40 mg total) by mouth daily. Use as needed for GERD sx 90 capsule 3   sucralfate (CARAFATE) 1 g tablet Take 1 tablet (1 g total) by mouth 4 (four) times daily -  with meals and at bedtime for 14 days.  56 tablet 0   No current facility-administered medications on file prior to visit.    Review of Systems:  As per HPI- otherwise negative.   Physical Examination: There were no vitals filed for this visit. There were no vitals filed for this visit. There is no height or weight on file to calculate BMI. Ideal Body Weight:    GEN: no acute distress. HEENT: Atraumatic, Normocephalic.  Ears and Nose: No external deformity. CV: RRR, No M/G/R. No JVD. No thrill. No extra heart sounds. PULM: CTA B, no wheezes, crackles, rhonchi. No retractions. No resp. distress. No accessory muscle use. ABD: S, NT, ND, +BS. No rebound. No HSM. EXTR: No c/c/e PSYCH: Normally interactive. Conversant.    Assessment and Plan: *** Physical exam today- encouraged healthy diet and exercise routine  Will plan further follow- up pending labs.  Signed Abbe Amsterdam, MD

## 2022-09-17 ENCOUNTER — Ambulatory Visit (INDEPENDENT_AMBULATORY_CARE_PROVIDER_SITE_OTHER): Payer: Medicare Other | Admitting: Family Medicine

## 2022-09-17 ENCOUNTER — Encounter: Payer: Self-pay | Admitting: Family Medicine

## 2022-09-17 VITALS — BP 121/72 | HR 63 | Temp 98.0°F | Resp 18 | Ht 74.0 in | Wt 186.0 lb

## 2022-09-17 DIAGNOSIS — Z23 Encounter for immunization: Secondary | ICD-10-CM

## 2022-09-17 DIAGNOSIS — Z130 Encounter for screening for diseases of the blood and blood-forming organs and certain disorders involving the immune mechanism: Secondary | ICD-10-CM

## 2022-09-17 DIAGNOSIS — K219 Gastro-esophageal reflux disease without esophagitis: Secondary | ICD-10-CM

## 2022-09-17 DIAGNOSIS — Z131 Encounter for screening for diabetes mellitus: Secondary | ICD-10-CM

## 2022-09-17 DIAGNOSIS — Z0000 Encounter for general adult medical examination without abnormal findings: Secondary | ICD-10-CM | POA: Diagnosis not present

## 2022-09-17 DIAGNOSIS — R972 Elevated prostate specific antigen [PSA]: Secondary | ICD-10-CM

## 2022-09-17 DIAGNOSIS — R944 Abnormal results of kidney function studies: Secondary | ICD-10-CM

## 2022-09-17 DIAGNOSIS — S41101A Unspecified open wound of right upper arm, initial encounter: Secondary | ICD-10-CM

## 2022-09-17 DIAGNOSIS — Z125 Encounter for screening for malignant neoplasm of prostate: Secondary | ICD-10-CM | POA: Diagnosis not present

## 2022-09-17 DIAGNOSIS — Z13220 Encounter for screening for lipoid disorders: Secondary | ICD-10-CM | POA: Diagnosis not present

## 2022-09-17 LAB — COMPREHENSIVE METABOLIC PANEL
ALT: 17 U/L (ref 0–53)
AST: 19 U/L (ref 0–37)
Albumin: 4.2 g/dL (ref 3.5–5.2)
Alkaline Phosphatase: 37 U/L — ABNORMAL LOW (ref 39–117)
BUN: 21 mg/dL (ref 6–23)
CO2: 28 mEq/L (ref 19–32)
Calcium: 9.5 mg/dL (ref 8.4–10.5)
Chloride: 103 mEq/L (ref 96–112)
Creatinine, Ser: 1.28 mg/dL (ref 0.40–1.50)
GFR: 57.49 mL/min — ABNORMAL LOW (ref >60.00)
Glucose, Bld: 96 mg/dL (ref 70–99)
Potassium: 4.8 mEq/L (ref 3.5–5.1)
Sodium: 138 mEq/L (ref 135–145)
Total Bilirubin: 0.5 mg/dL (ref 0.2–1.2)
Total Protein: 6.9 g/dL (ref 6.0–8.3)

## 2022-09-17 LAB — LIPID PANEL
Cholesterol: 199 mg/dL (ref 0–200)
HDL: 59.5 mg/dL (ref >39.00)
LDL Cholesterol: 126 mg/dL — ABNORMAL HIGH (ref 0–99)
NonHDL: 139.42
Total CHOL/HDL Ratio: 3
Triglycerides: 69 mg/dL (ref 0.0–149.0)
VLDL: 13.8 mg/dL (ref 0.0–40.0)

## 2022-09-17 LAB — CBC
HCT: 49.6 % (ref 39.0–52.0)
Hemoglobin: 16.3 g/dL (ref 13.0–17.0)
MCHC: 32.8 g/dL (ref 30.0–36.0)
MCV: 88.7 fl (ref 78.0–100.0)
Platelets: 200 10*3/uL (ref 150.0–400.0)
RBC: 5.59 Mil/uL (ref 4.22–5.81)
RDW: 14.5 % (ref 11.5–15.5)
WBC: 4.9 10*3/uL (ref 4.0–10.5)

## 2022-09-17 LAB — PSA: PSA: 1.76 ng/mL (ref 0.10–4.00)

## 2022-09-17 LAB — HEMOGLOBIN A1C: Hgb A1c MFr Bld: 5.6 % (ref 4.6–6.5)

## 2022-09-17 MED ORDER — OMEPRAZOLE 40 MG PO CPDR: 40.0000 mg | DELAYED_RELEASE_CAPSULE | Freq: Every day | ORAL | 3 refills | Status: AC

## 2022-09-29 ENCOUNTER — Ambulatory Visit (HOSPITAL_BASED_OUTPATIENT_CLINIC_OR_DEPARTMENT_OTHER)
Admission: RE | Admit: 2022-09-29 | Discharge: 2022-09-29 | Disposition: A | Discharge status: Home / Self Care | Payer: Self-pay | Source: Ambulatory Visit | Attending: Family Medicine | Admitting: Family Medicine

## 2022-09-29 DIAGNOSIS — Z13220 Encounter for screening for lipoid disorders: Secondary | ICD-10-CM | POA: Insufficient documentation

## 2022-09-30 ENCOUNTER — Encounter: Payer: Self-pay | Admitting: Family Medicine

## 2022-09-30 DIAGNOSIS — E785 Hyperlipidemia, unspecified: Secondary | ICD-10-CM

## 2022-09-30 MED ORDER — ROSUVASTATIN CALCIUM 10 MG PO TABS: 10.0000 mg | ORAL_TABLET | Freq: Every day | ORAL | 3 refills | Status: AC

## 2022-10-06 ENCOUNTER — Encounter: Payer: Self-pay | Admitting: Family Medicine

## 2022-10-06 DIAGNOSIS — R911 Solitary pulmonary nodule: Secondary | ICD-10-CM

## 2022-10-06 NOTE — Addendum Note (Signed)
Addended by: Abbe Amsterdam C on: 10/06/2022 11:21 AM   Modules accepted: Orders

## 2022-10-09 ENCOUNTER — Ambulatory Visit (HOSPITAL_BASED_OUTPATIENT_CLINIC_OR_DEPARTMENT_OTHER)
Admission: RE | Admit: 2022-10-09 | Discharge: 2022-10-09 | Disposition: A | Discharge status: Home / Self Care | Payer: Medicare Other | Source: Ambulatory Visit | Attending: Family Medicine | Admitting: Family Medicine

## 2022-10-09 DIAGNOSIS — R911 Solitary pulmonary nodule: Secondary | ICD-10-CM | POA: Diagnosis not present

## 2022-10-15 ENCOUNTER — Encounter: Payer: Self-pay | Admitting: Family Medicine

## 2023-01-09 IMAGING — US US ABDOMEN LIMITED
1 series · 13 of 25 positions shown · non-contrast
Comparison: February 27, 2020

CLINICAL DATA: Previous gallbladder polyp and liver lesions

EXAM:
ULTRASOUND ABDOMEN LIMITED RIGHT UPPER QUADRANT

[Series 1: us abdomen limited · 13 of 53 slices shown]
[im 1/53]
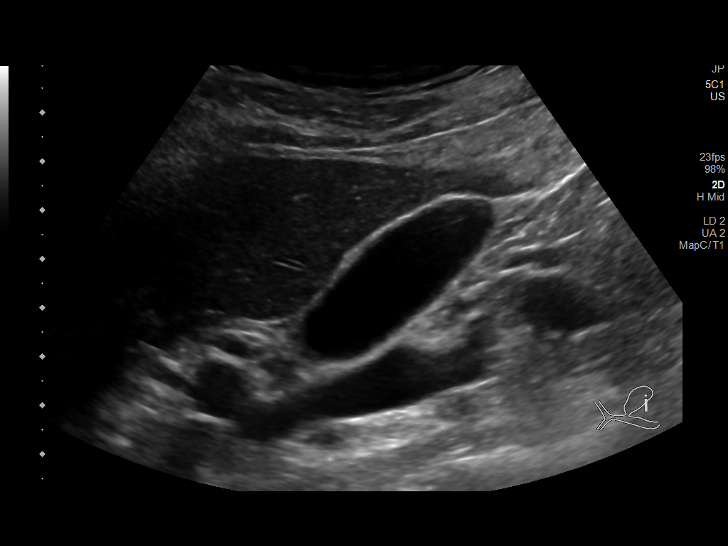
[im 5/53]
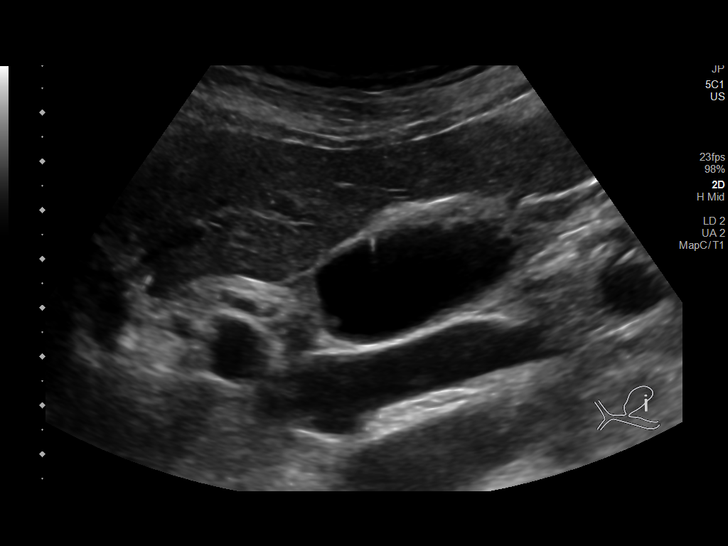
[im 9/53]
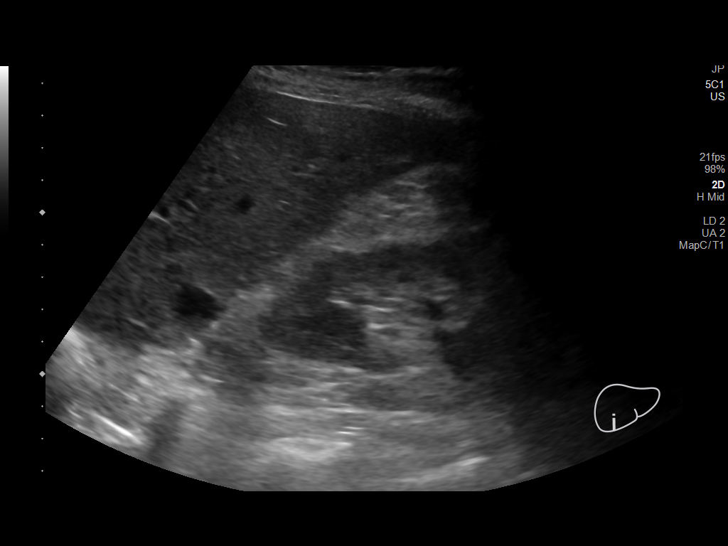
[im 14/53]
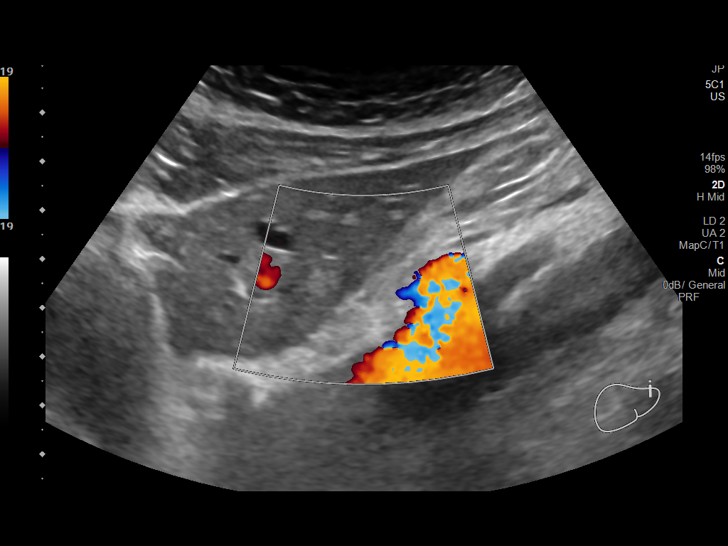
[im 18/53]
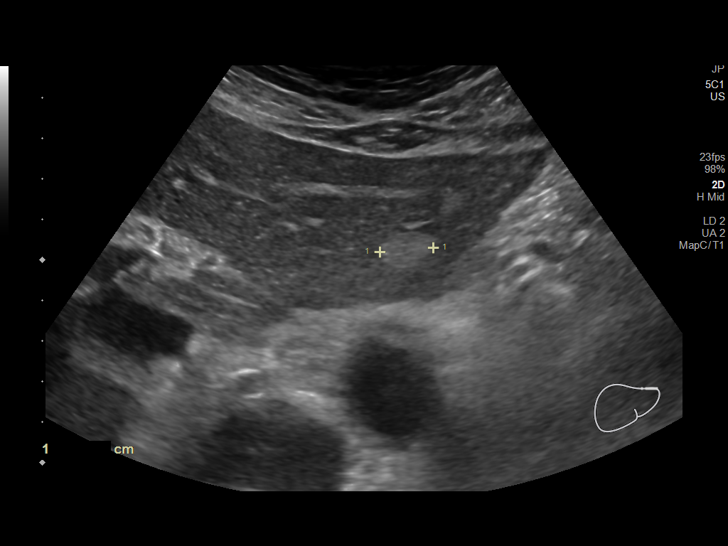
[im 22/53]
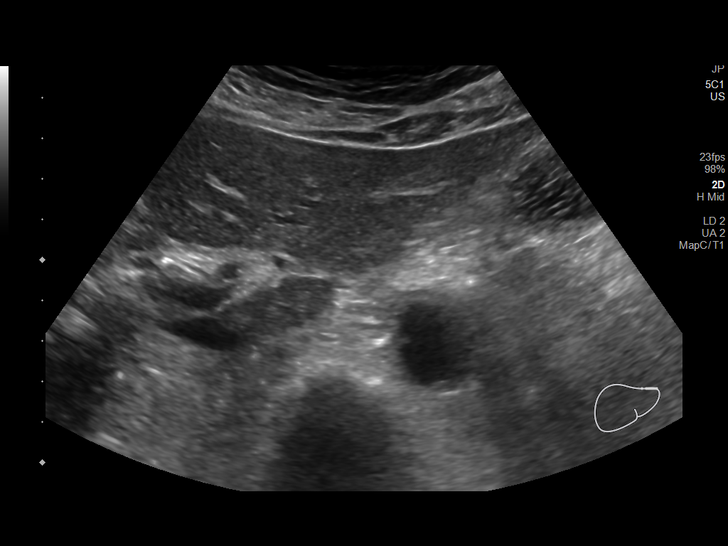
[im 27/53]
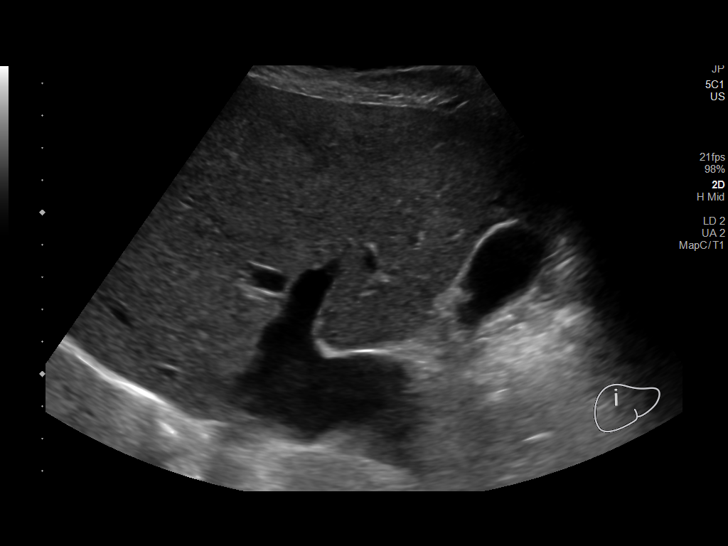
[im 31/53]
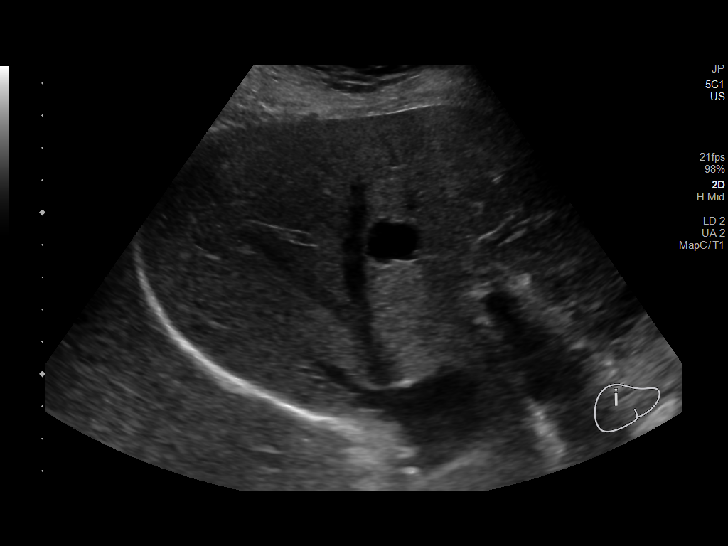
[im 35/53]
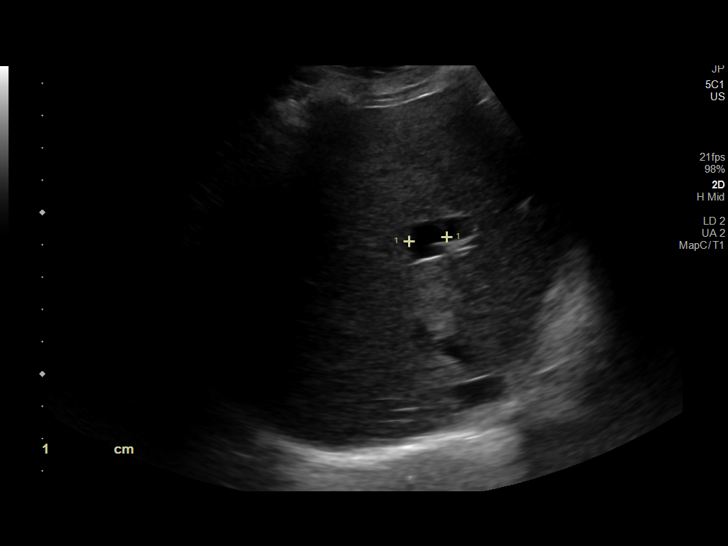
[im 40/53]
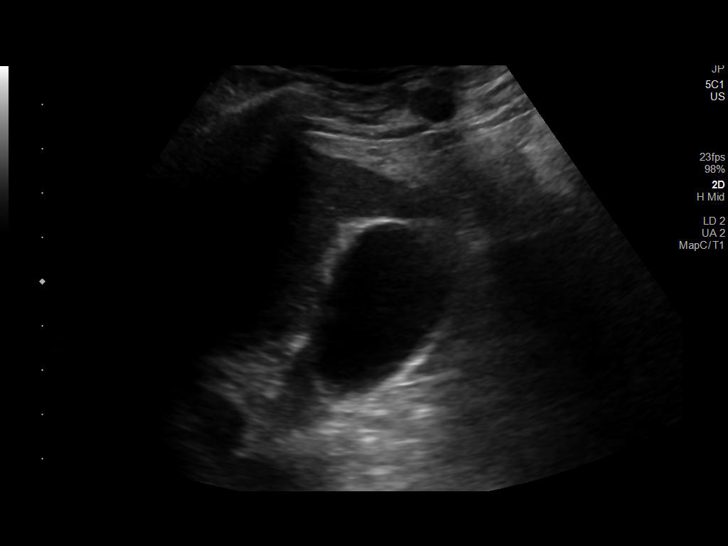
[im 44/53]
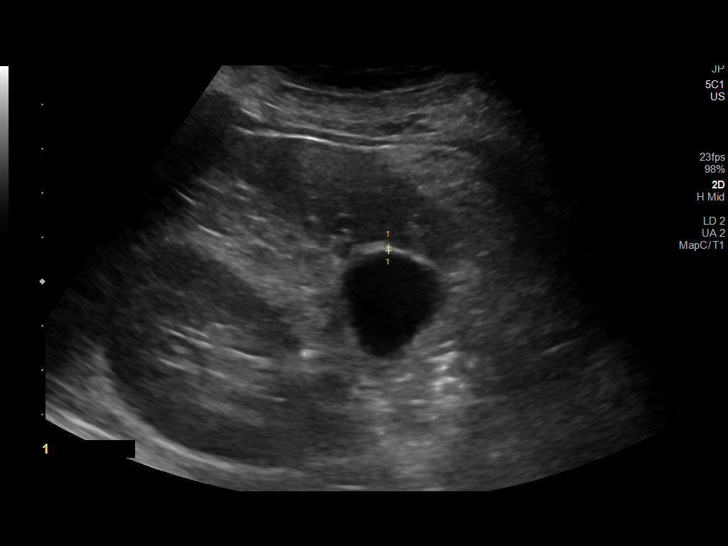
[im 48/53]
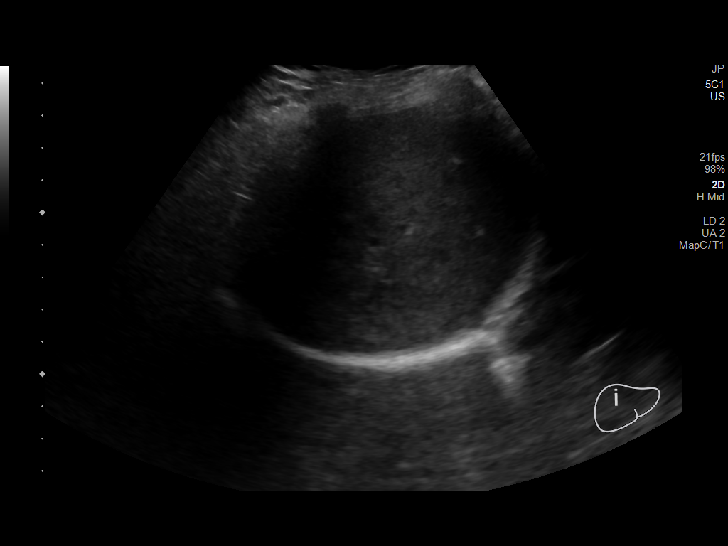
[im 53/53]
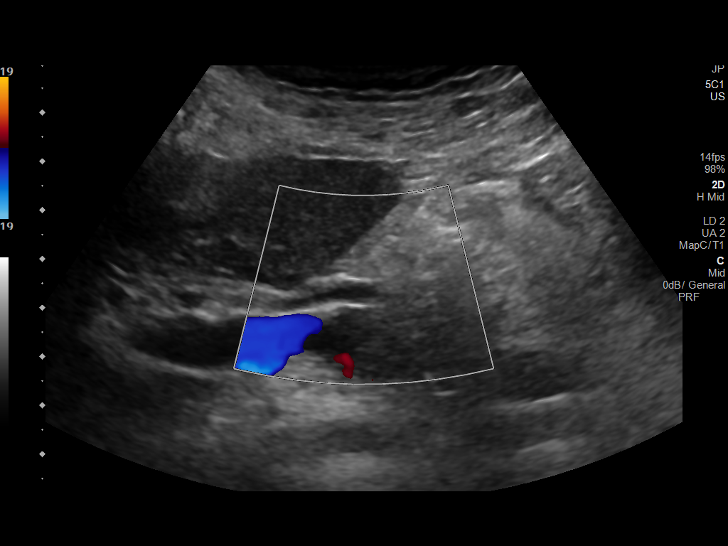

[13 of 25 positions shown; findings below may reference images not displayed]

FINDINGS: Gallbladder:

Within the gallbladder, there is a 5 mm echogenic focus which
neither moves nor shadows, a presumed polyp. There are no echogenic
foci in the gallbladder which move and shadow as is expected with
cholelithiasis. No gallbladder wall thickening or pericholecystic
fluid. No sonographic Murphy sign noted by sonographer.

Common bile duct:

Diameter: 4 mm. No intrahepatic or extrahepatic biliary duct
dilatation.

Liver:

Uniformly echogenic mass in the left lobe of the liver consistent
with hemangioma measuring 1.0 x 1.1 x 1.3 cm again noted. There is a
cyst in the right lobe of the liver measuring 2.5 x 2.1 x 2.3 cm. A
second cyst in the right lobe of the liver measures 1.2 x 1.5 x
cm. Scattered subcentimeter liver cysts also present and stable.
Within normal limits in parenchymal echogenicity. Portal vein is
patent on color Doppler imaging with normal direction of blood flow
towards the liver.

Other: None.
IMPRESSION: 1. 5 mm apparent polyp within the gallbladder. Per consensus
guidelines, a polyp of this size does not warrant additional imaging
surveillance. No gallstones, gallbladder wall thickening, or
pericholecystic fluid.

2. Stable appearing apparent hemangioma left lobe of liver. Several
liver cysts elsewhere essentially stable. No new liver lesions.

## 2023-03-26 ENCOUNTER — Encounter (HOSPITAL_BASED_OUTPATIENT_CLINIC_OR_DEPARTMENT_OTHER): Payer: Self-pay | Admitting: Emergency Medicine

## 2023-03-26 ENCOUNTER — Emergency Department (HOSPITAL_BASED_OUTPATIENT_CLINIC_OR_DEPARTMENT_OTHER)
Admission: EM | Admit: 2023-03-26 | Discharge: 2023-03-26 | Disposition: A | Discharge status: Home / Self Care | Payer: Medicare Other | Attending: Emergency Medicine | Admitting: Emergency Medicine

## 2023-03-26 ENCOUNTER — Encounter: Payer: Self-pay | Admitting: Family Medicine

## 2023-03-26 ENCOUNTER — Other Ambulatory Visit: Payer: Self-pay

## 2023-03-26 DIAGNOSIS — H6121 Impacted cerumen, right ear: Secondary | ICD-10-CM | POA: Diagnosis not present

## 2023-03-26 DIAGNOSIS — H5789 Other specified disorders of eye and adnexa: Secondary | ICD-10-CM | POA: Diagnosis present

## 2023-03-26 MED ORDER — DOCUSATE SODIUM 100 MG PO CAPS
100.0000 mg | ORAL_CAPSULE | Freq: Once | ORAL | Status: AC
  Administered 2023-03-26: 100 mg via ORAL
  Filled 2023-03-26: qty 1

## 2023-03-26 NOTE — ED Provider Notes (Signed)
[place] [facility] Provider Note   CSN: [id number] Arrival date & time: 03/26/23  1251     History  Chief Complaint  Patient presents with   Hearing Problem    Srivatsa Mashek III is a 67 y.o. male presents today for right ear hearing loss.  Patient states that he has had this issue in the past that was due to earwax accumulation.  Patient attempted to irrigate at home with no success.  Patient denies sore throat, fever, ear pain, congestion, dizziness or cough.  Patient does report increased tinnitus since hearing loss but notes he has tinnitus at baseline.  HPI     Home Medications Prior to Admission medications   Medication Sig Start Date End Date Taking? Authorizing Provider  diphenhydramine-acetaminophen (TYLENOL PM) 25-500 MG TABS tablet Take 1 tablet by mouth at bedtime as needed.    [provider]  loratadine (CLARITIN) 10 MG tablet Take 10 mg by mouth daily.    [provider]  omeprazole (PRILOSEC) 40 MG capsule Take 1 capsule (40 mg total) by mouth daily. Use as needed for GERD sx 09/17/22   Copland, Gwenlyn Found, MD  rosuvastatin (CRESTOR) 10 MG tablet Take 1 tablet (10 mg total) by mouth daily. 09/30/22   Copland, Gwenlyn Found, MD      Allergies    Codeine    Review of Systems   Review of Systems  HENT:  Positive for hearing loss and tinnitus.     Physical Exam Updated Vital Signs BP 105/68 (BP Location: Left Arm)   Pulse 64   Temp 97.6 F (36.4 C) (Oral)   Resp 15   SpO2 97%  Physical Exam Vitals and nursing note reviewed.  Constitutional:      General: He is not in acute distress.    Appearance: He is well-developed.  HENT:     Head: Normocephalic and atraumatic.     Right Ear: There is impacted cerumen.     Left Ear: Tympanic membrane normal.     Nose: Nose normal. No congestion.     Mouth/Throat:     Mouth: Mucous membranes are moist.  Eyes:     Conjunctiva/sclera: Conjunctivae normal.   Cardiovascular:     Rate and Rhythm: Normal rate and regular rhythm.     Heart sounds: No murmur heard. Pulmonary:     Effort: Pulmonary effort is normal. No respiratory distress.     Breath sounds: Normal breath sounds.  Abdominal:     Palpations: Abdomen is soft.     Tenderness: There is no abdominal tenderness.  Musculoskeletal:        General: No swelling.     Cervical back: Neck supple.  Skin:    General: Skin is warm and dry.     Capillary Refill: Capillary refill takes less than 2 seconds.  Neurological:     General: No focal deficit present.     Mental Status: He is alert.  Psychiatric:        Mood and Affect: Mood normal.     ED Results / Procedures / Treatments   Labs (all labs ordered are listed, but only abnormal results are displayed) Labs Reviewed - No data to display  EKG None  Radiology No results found.  Procedures Procedures    Medications Ordered in ED Medications  docusate sodium (COLACE) capsule 100 mg (100 mg Oral Given 03/26/23 1441)    ED Course/ Medical Decision Making/ A&P  Medical Decision Making Risk OTC drugs.   This patient presents to the ED with chief complaint(s) of right ear hearing loss with pertinent past medical history of cerumen impaction which further complicates the presenting complaint. The complaint involves an extensive differential diagnosis and also carries with it a high risk of complications and morbidity.    The differential diagnosis includes cerumen impaction  Additional history obtained: Records reviewed family medicine office notes  ED Course and Reassessment: Colace placed in ear and ear thoroughly irrigated.3 After irrigation ear was reexamined and found to have normal TM and no signs of infection or trauma.  Patient states his hearing has returned to baseline.  Consultation: - Consulted or discussed management/test interpretation w/ external professional:  None  Consideration for admission or further workup: Considered for admission or further workup however patients vital signs and physical exam were reassuring.  Patient's hearing has returned to baseline after ear irrigation.  Patient is safe for discharge and follow-up with PCP as needed.         Final Clinical Impression(s) / ED Diagnoses Final diagnoses:  Impacted cerumen of right ear    Rx / DC Orders ED Discharge Orders     None         Dolphus Jenny, PA-C 03/26/23 1519    Royanne Foots, DO 03/29/23 1829

## 2023-03-26 NOTE — ED Triage Notes (Signed)
Right hearing loss, had this in the past , due to wax accumulation . Reports attempted to irrigated it , no success .  Denies URI symptoms . Alert and oriented x 4 . Ambulatory with steady gait .

## 2023-03-26 NOTE — Discharge Instructions (Signed)
Today you are seen for cerumen impaction of your right ear.  Thank you for letting us treat you today. After performing a physical exam, I feel you are safe to go home. Please follow up with your PCP in the next several days and provide them with your records from this visit. Return to the Emergency Room if pain becomes severe or symptoms worsen.

## 2023-03-26 NOTE — ED Notes (Signed)
Pts R ear flushed with warm water and peroxide solution.  Cerumen irrigated out, pt reports relief.  EDP made aware.

## 2023-03-26 NOTE — ED Notes (Signed)
D/c paperwork reviewed with pt, including follow up care.  No questions or concerns voiced at time of d/c. . Pt verbalized understanding, Ambulatory without assistance to ED exit, NAD.   

## 2023-06-08 DIAGNOSIS — Z85828 Personal history of other malignant neoplasm of skin: Secondary | ICD-10-CM | POA: Diagnosis not present

## 2023-06-08 DIAGNOSIS — L570 Actinic keratosis: Secondary | ICD-10-CM | POA: Diagnosis not present

## 2023-06-08 DIAGNOSIS — B351 Tinea unguium: Secondary | ICD-10-CM | POA: Diagnosis not present

## 2023-06-08 DIAGNOSIS — Z129 Encounter for screening for malignant neoplasm, site unspecified: Secondary | ICD-10-CM | POA: Diagnosis not present

## 2023-06-08 DIAGNOSIS — D1801 Hemangioma of skin and subcutaneous tissue: Secondary | ICD-10-CM | POA: Diagnosis not present

## 2023-06-08 DIAGNOSIS — L821 Other seborrheic keratosis: Secondary | ICD-10-CM | POA: Diagnosis not present

## 2023-08-26 ENCOUNTER — Telehealth: Payer: Self-pay | Admitting: Family Medicine

## 2023-08-26 NOTE — Telephone Encounter (Signed)
 Copied from CRM [id number]. Topic: Medicare AWV >> Aug 26, 2023  1:53 PM Juliana Ocean wrote: Reason for CRM: Called 08/26/2023 to sched AWV - Hangs up  Rosalee Collins; Care Guide Ambulatory Clinical Support  l [facility] Direct Dial: [phone]

## 2023-09-24 NOTE — Progress Notes (Deleted)
 [facility] [address], [address] [place], [place] [address] [phone]  Date:  09/28/2023   Name:  Jacob Estes   DOB:  07/25/1955   MRN:  [id number]  PCP:  Kaylee Partridge, MD    Chief Complaint: No chief complaint on file.   History of Present Illness:  [name] is a 68 y.o. very pleasant male patient who presents with the following:  Patient seen today with concern of pain in joints, especially his hands.  Most recent visit myself was about 1 year ago for his physical Generally in good health except for history of hernia repair and GERD, occasional panic attack  He is retired from his work in [employer].  He has 2 children and 5 grandchildren, he also helps care for his elderly parents  Patient Active Problem List   Diagnosis Date Noted   Gastroesophageal reflux disease 09/15/2022    No past medical history on file.  Past Surgical History:  Procedure Laterality Date   HERNIA REPAIR      Social History   Tobacco Use   Smoking status: Never   Smokeless tobacco: Never  Vaping Use   Vaping status: Never Used  Substance Use Topics   Alcohol use: Yes    Comment: very very little   Drug use: Never    No family history on file.  Allergies  Allergen Reactions   Codeine Hives and Other (See Comments)    Heart problems Heart problems     Medication list has been reviewed and updated.  Current Outpatient Medications on File Prior to Visit  Medication Sig Dispense Refill   diphenhydramine -acetaminophen (TYLENOL PM) 25-500 MG TABS tablet Take 1 tablet by mouth at bedtime as needed.     loratadine (CLARITIN) 10 MG tablet Take 10 mg by mouth daily.     omeprazole  (PRILOSEC) 40 MG capsule Take 1 capsule (40 mg total) by mouth daily. Use as needed for GERD sx 90 capsule 3   rosuvastatin  (CRESTOR ) 10 MG tablet Take 1 tablet (10 mg total) by mouth daily. 90 tablet 3   No current  facility-administered medications on file prior to visit.    Review of Systems:  As per HPI- otherwise negative.   Physical Examination: There were no vitals filed for this visit. There were no vitals filed for this visit. There is no height or weight on file to calculate BMI. Ideal Body Weight:    GEN: no acute distress. HEENT: Atraumatic, Normocephalic.  Ears and Nose: No external deformity. CV: RRR, No M/G/R. No JVD. No thrill. No extra heart sounds. PULM: CTA B, no wheezes, crackles, rhonchi. No retractions. No resp. distress. No accessory muscle use. ABD: S, NT, ND, +BS. No rebound. No HSM. EXTR: No c/c/e PSYCH: Normally interactive. Conversant.    Assessment and Plan: ***  Signed Gates Kasal, MD

## 2023-09-28 ENCOUNTER — Ambulatory Visit: Admitting: Family Medicine

## 2024-01-08 ENCOUNTER — Telehealth: Payer: Self-pay

## 2024-01-08 ENCOUNTER — Telehealth: Payer: Self-pay | Admitting: *Deleted

## 2024-01-08 ENCOUNTER — Ambulatory Visit (INDEPENDENT_AMBULATORY_CARE_PROVIDER_SITE_OTHER): Admitting: *Deleted

## 2024-01-08 VITALS — Ht 74.0 in | Wt 182.0 lb

## 2024-01-08 DIAGNOSIS — Z125 Encounter for screening for malignant neoplasm of prostate: Secondary | ICD-10-CM

## 2024-01-08 DIAGNOSIS — Z1329 Encounter for screening for other suspected endocrine disorder: Secondary | ICD-10-CM

## 2024-01-08 DIAGNOSIS — Z0000 Encounter for general adult medical examination without abnormal findings: Secondary | ICD-10-CM | POA: Diagnosis not present

## 2024-01-08 DIAGNOSIS — Z13220 Encounter for screening for lipoid disorders: Secondary | ICD-10-CM

## 2024-01-08 DIAGNOSIS — Z130 Encounter for screening for diseases of the blood and blood-forming organs and certain disorders involving the immune mechanism: Secondary | ICD-10-CM

## 2024-01-08 DIAGNOSIS — Z131 Encounter for screening for diabetes mellitus: Secondary | ICD-10-CM

## 2024-01-08 NOTE — Addendum Note (Signed)
 Addended by: WATT RAISIN C on: 01/08/2024 12:31 PM   Modules accepted: Orders

## 2024-01-08 NOTE — Telephone Encounter (Signed)
 Copied from CRM [id number]. Topic: Clinical - Request for Lab/Test Order >> Jan 07, 2024  5:03 PM Wess RAMAN wrote: Reason for CRM: Patient needs lab orders placed prior to upcoming appt for annual physical on 01/20/24

## 2024-01-08 NOTE — Patient Instructions (Signed)
 Jacob Estes , Thank you for taking time out of your busy schedule to complete your Annual Wellness Visit with me. I enjoyed our conversation and look forward to speaking with you again next year. I, as well as your care team,  appreciate your ongoing commitment to your health goals. Please review the following plan we discussed and let me know if I can assist you in the future. Your Game plan/ To Do List     Follow up Visits: Next Medicare AWV with our clinical staff: 01/17/25 8:20am, telephone    Next Office Visit with your provider: 01/20/24 2:20pm, Dr Watt  Clinician Recommendations:  Aim for 30 minutes of exercise or brisk walking, 6-8 glasses of water, and 5 servings of fruits and vegetables each day.    You will need to get the following vaccines at your local pharmacy: Flu     This is a list of the screening recommended for you and due dates:  Health Maintenance  Topic Date Due   Medicare Annual Wellness Visit  08/24/2022   Flu Shot  11/20/2023   COVID-19 Vaccine (3 - Moderna risk series) 01/07/2025*   Colon Cancer Screening  04/04/2029   DTaP/Tdap/Td vaccine (3 - Td or Tdap) 09/16/2032   Pneumococcal Vaccine for age over 18  Completed   Hepatitis C Screening  Completed   Zoster (Shingles) Vaccine  Completed   HPV Vaccine  Aged Out   Meningitis B Vaccine  Aged Out  *Topic was postponed. The date shown is not the original due date.    Advanced directives: (Copy Requested) Please bring a copy of your health care power of attorney and living will to the office to be added to your chart at your convenience. You can mail to [facility] [address] [place], [place] [address] or email to [email or web address] Advance Care Planning is important because it:  [x]  Makes sure you receive the medical care that is consistent with your values, goals, and preferences  [x]  It provides guidance to your family and loved ones and reduces their decisional burden  about whether or not they are making the right decisions based on your wishes.  Follow the link provided in your after visit summary or read over the paperwork we have mailed to you to help you started getting your Advance Directives in place. If you need assistance in completing these, please reach out to us  so that we can help you!  See attachments for Preventive Care and Fall Prevention Tips.

## 2024-01-08 NOTE — Progress Notes (Signed)
 Please attest this visit in the absence of patient primary care provider.    Subjective:   Jacob Estes is a 68 y.o. who presents for a Medicare Wellness preventive visit.  As a reminder, Annual Wellness Visits don't include a physical exam, and some assessments may be limited, especially if this visit is performed virtually. We may recommend an in-person follow-up visit with your provider if needed.  Visit Complete: Virtual I connected with  Jacob Estes on 01/08/24 by a audio enabled telemedicine application and verified that I am speaking with the correct person using two identifiers.  Patient Location: Home  Provider Location: Office/Clinic  I discussed the limitations of evaluation and management by telemedicine. The patient expressed understanding and agreed to proceed.  Vital Signs: Because this visit was a virtual/telehealth visit, some criteria may be missing or patient reported. Any vitals not documented were not able to be obtained and vitals that have been documented are patient reported.  VideoDeclined- This patient declined Librarian, academic. Therefore the visit was completed with audio only.  Persons Participating in Visit: Patient.  AWV Questionnaire: No: Patient Medicare AWV questionnaire was not completed prior to this visit.  Cardiac Risk Factors include: male gender;advanced age (>44men, >80 women);dyslipidemia     Objective:    Today's Vitals   01/08/24 0821  Weight: 182 lb (82.6 kg)  Height: 6' 2 (1.88 m)   Body mass index is 23.37 kg/m.     01/08/2024    8:33 AM 03/26/2023    1:03 PM 08/23/2021   10:17 AM 02/18/2020    6:32 PM  Advanced Directives  Does Patient Have a Medical Advance Directive? Yes Yes Yes Yes  Type of Estate agent of [place];Living will Living will;Healthcare Power of [employer] Power of [place];Living will   Does patient want to make changes to medical advance  directive? No - Patient declined   No - Patient declined  Copy of Healthcare Power of Attorney in Chart? No - copy requested  No - copy requested     Current Medications (verified) Outpatient Encounter Medications as of 01/08/2024  Medication Sig   diphenhydramine -acetaminophen (TYLENOL PM) 25-500 MG TABS tablet Take 1 tablet by mouth at bedtime as needed. (Patient not taking: Reported on 01/08/2024)   loratadine (CLARITIN) 10 MG tablet Take 10 mg by mouth daily. (Patient not taking: Reported on 01/08/2024)   omeprazole  (PRILOSEC) 40 MG capsule Take 1 capsule (40 mg total) by mouth daily. Use as needed for GERD sx (Patient not taking: Reported on 01/08/2024)   rosuvastatin  (CRESTOR ) 10 MG tablet Take 1 tablet (10 mg total) by mouth daily. (Patient not taking: Reported on 01/08/2024)   No facility-administered encounter medications on file as of 01/08/2024.    Allergies (verified) Codeine   History: History reviewed. No pertinent past medical history. Past Surgical History:  Procedure Laterality Date   HERNIA REPAIR     History reviewed. No pertinent family history. Social History   Socioeconomic History   Marital status: Divorced    Spouse name: Not on file   Number of children: Not on file   Years of education: Not on file   Highest education level: Not on file  Occupational History   Not on file  Tobacco Use   Smoking status: Never   Smokeless tobacco: Never  Vaping Use   Vaping status: Never Used  Substance and Sexual Activity   Alcohol use: Yes    Comment: very very  little   Drug use: Never   Sexual activity: Not on file  Other Topics Concern   Not on file  Social History Narrative   Not on file   Social Drivers of Health   Financial Resource Strain: Low Risk  (01/08/2024)   Overall Financial Resource Strain (CARDIA)    Difficulty of Paying Living Expenses: Not hard at all  Food Insecurity: No Food Insecurity (01/08/2024)   Hunger Vital Sign    Worried About  Running Out of Food in the Last Year: Never true    Ran Out of Food in the Last Year: Never true  Transportation Needs: No Transportation Needs (01/08/2024)   PRAPARE - Administrator, Civil Service (Medical): No    Lack of Transportation (Non-Medical): No  Physical Activity: Sufficiently Active (01/08/2024)   Exercise Vital Sign    Days of Exercise per Week: 5 days    Minutes of Exercise per Session: 60 min  Stress: No Stress Concern Present (01/08/2024)   [employer] of Occupational Health - Occupational Stress Questionnaire    Feeling of Stress: Not at all  Social Connections: Moderately Isolated (01/08/2024)   Social Connection and Isolation Panel    Frequency of Communication with Friends and Family: Never    Frequency of Social Gatherings with Friends and Family: More than three times a week    Attends Religious Services: Never    Database administrator or Organizations: Yes    Attends Engineer, structural: More than 4 times per year    Marital Status: Divorced    Tobacco Counseling Counseling given: Not Answered    Clinical Intake:  Pre-visit preparation completed: Yes  Pain : No/denies pain     BMI - recorded: 23.37 Nutritional Status: BMI of 19-24  Normal Nutritional Risks: None Diabetes: No  Lab Results  Component Value Date   HGBA1C 5.6 09/17/2022   HGBA1C 5.6 07/01/2021   HGBA1C 5.3 02/08/2020     How often do you need to have someone help you when you read instructions, pamphlets, or other written materials from your doctor or pharmacy?: 1 - Never  Interpreter Needed?: No  Information entered by :: Ryaan Vanwagoner, CMA (AAMA)   Activities of Daily Living     01/08/2024    8:49 AM  In your present state of health, do you have any difficulty performing the following activities:  Hearing? 0  Vision? 0  Difficulty concentrating or making decisions? 0  Walking or climbing stairs? 0  Dressing or bathing? 0  Doing errands,  shopping? 0  Preparing Food and eating ? N  Using the Toilet? N  In the past six months, have you accidently leaked urine? N  Do you have problems with loss of bowel control? N  Managing your Medications? N  Managing your Finances? N  Housekeeping or managing your Housekeeping? N    Patient Care Team: Copland, Harlene BROCKS, MD as PCP - [facility] ([facility]  I have updated your Care Teams any recent Medical Services you may have received from other providers in the past year.     Assessment:   This is a routine wellness examination for [facility].  Hearing/Vision screen Hearing Screening - Comments:: Denies hearing difficulties.  Vision Screening - Comments:: Has eye exam scheduled with [facility].   Goals Addressed               This Visit's Progress  Patient Stated (pt-stated)        To maintain current fitness level       Depression Screen     01/08/2024    8:29 AM 09/17/2022   10:09 AM 08/23/2021   10:18 AM 07/01/2021    1:20 PM  PHQ 2/9 Scores  PHQ - 2 Score 0 0 0 0  PHQ- 9 Score 4       Fall Risk     01/08/2024    8:26 AM 09/17/2022   10:09 AM 08/23/2021   10:18 AM 07/01/2021    1:20 PM  Fall Risk   Falls in the past year? 0 0 0 0  Number falls in past yr: 0 0 0 0  Injury with Fall? 0 0 0 0  Risk for fall due to : No Fall Risks No Fall Risks No Fall Risks   Follow up Education provided Falls evaluation completed Falls evaluation completed;Education provided;Falls prevention discussed       Data saved with a previous flowsheet row definition    MEDICARE RISK AT HOME:  Medicare Risk at Home Any stairs in or around the home?: Yes If so, are there any without handrails?: No Home free of loose throw rugs in walkways, pet beds, electrical cords, etc?: Yes Adequate lighting in your home to reduce risk of falls?: Yes Life alert?: No Use of a cane, walker or w/c?: No Grab bars in the bathroom?: No Shower  chair or bench in shower?: Yes Elevated toilet seat or a handicapped toilet?: Yes  TIMED UP AND GO:  Was the test performed?  No  Cognitive Function: 6CIT completed        01/08/2024    8:33 AM 08/23/2021   10:19 AM  6CIT Screen  What Year? 0 points 0 points  What month? 0 points 0 points  What time? 0 points 0 points  Count back from 20 0 points 0 points  Months in reverse 2 points 0 points  Repeat phrase 2 points 0 points  Total Score 4 points 0 points    Immunizations Immunization History  Administered Date(s) Administered    sv, Bivalent, Protein Subunit Rsvpref,pf [place]) 12/31/2021   Fluad Quad(high Dose 65+) 12/31/2021   Influenza,inj,Quad PF,6+ Mos 12/18/2016, 12/13/2018   MODERNA COVID-19 SARS-COV-2 PEDS BIVALENT BOOSTER 74yr-28yr 11/07/2019   Moderna Covid-19 Vaccine Bivalent Booster 57yrs & up 01/31/2021   Pneumococcal Conjugate-13 02/23/2019   Pneumococcal Polysaccharide-23 07/01/2021   Rotavirus,unspecified  12/31/2021   Td 09/17/2022   Tdap 07/22/2012   Zoster Recombinant(Shingrix) 07/31/2021, 12/31/2021    Screening Tests Health Maintenance  Topic Date Due   Influenza Vaccine  11/20/2023   COVID-19 Vaccine (3 - Moderna risk series) 01/07/2025 (Originally 02/28/2021)   Medicare Annual Wellness (AWV)  01/07/2025   Colonoscopy  04/04/2029   DTaP/Tdap/Td (3 - Td or Tdap) 09/16/2032   Pneumococcal Vaccine: 50+ Years  Completed   Hepatitis C Screening  Completed   Zoster Vaccines- Shingrix  Completed   HPV VACCINES  Aged Out   Meningococcal B Vaccine  Aged Out    Health Maintenance Items Addressed: Will get flu vaccine at pharmacy. All other HM up to date.  Additional Screening:  Vision Screening: Recommended annual ophthalmology exams for early detection of glaucoma and other disorders of the eye. Is the patient up to date with their annual eye exam?  Yes  Who is the provider or what is the name of the office in which the patient attends annual  eye  exams? [facility].  Dental Screening: Recommended annual dental exams for proper oral hygiene  Community Resource Referral / Chronic Care Management: CRR required this visit?  No   CCM required this visit?  No   Plan:    I have personally reviewed and noted the following in the patient's chart:   Medical and social history Use of alcohol, tobacco or illicit drugs  Current medications and supplements including opioid prescriptions. Patient is not currently taking opioid prescriptions. Functional ability and status Nutritional status Physical activity Advanced directives List of other physicians Hospitalizations, surgeries, and ER visits in previous 12 months Vitals Screenings to include cognitive, depression, and falls Referrals and appointments  In addition, I have reviewed and discussed with patient certain preventive protocols, quality metrics, and best practice recommendations. A written personalized care plan for preventive services as well as general preventive health recommendations were provided to patient.   Lolita Libra, CMA   01/08/2024   After Visit Summary: (MyChart) Due to this being a telephonic visit, the after visit summary with patients personalized plan was offered to patient via MyChart   Notes: See phone note

## 2024-01-08 NOTE — Telephone Encounter (Signed)
 Pt had AWV today.  He reports that he isn't taken any  medications at present.  He stopped the Pepcid  after and EGD that showed no signs of reflux.  He never started the rosuvastatin  and doesn't plan to take it.  He has an OV with PCP on 01/20/24.

## 2024-01-11 ENCOUNTER — Ambulatory Visit: Payer: Self-pay | Admitting: Family Medicine

## 2024-01-11 ENCOUNTER — Other Ambulatory Visit (INDEPENDENT_AMBULATORY_CARE_PROVIDER_SITE_OTHER)

## 2024-01-11 DIAGNOSIS — Z13220 Encounter for screening for lipoid disorders: Secondary | ICD-10-CM

## 2024-01-11 DIAGNOSIS — Z130 Encounter for screening for diseases of the blood and blood-forming organs and certain disorders involving the immune mechanism: Secondary | ICD-10-CM | POA: Diagnosis not present

## 2024-01-11 DIAGNOSIS — Z125 Encounter for screening for malignant neoplasm of prostate: Secondary | ICD-10-CM | POA: Diagnosis not present

## 2024-01-11 DIAGNOSIS — Z131 Encounter for screening for diabetes mellitus: Secondary | ICD-10-CM

## 2024-01-11 LAB — COMPREHENSIVE METABOLIC PANEL WITH GFR
ALT: 15 U/L (ref 0–53)
AST: 16 U/L (ref 0–37)
Albumin: 4.5 g/dL (ref 3.5–5.2)
Alkaline Phosphatase: 31 U/L — ABNORMAL LOW (ref 39–117)
BUN: 20 mg/dL (ref 6–23)
CO2: 31 meq/L (ref 19–32)
Calcium: 9.7 mg/dL (ref 8.4–10.5)
Chloride: 102 meq/L (ref 96–112)
Creatinine, Ser: 1.16 mg/dL (ref 0.40–1.50)
GFR: 64.1 mL/min (ref >60.00)
Glucose, Bld: 85 mg/dL (ref 70–99)
Potassium: 4.7 meq/L (ref 3.5–5.1)
Sodium: 139 meq/L (ref 135–145)
Total Bilirubin: 0.9 mg/dL (ref 0.2–1.2)
Total Protein: 6.7 g/dL (ref 6.0–8.3)

## 2024-01-11 LAB — CBC
HCT: 49 % (ref 39.0–52.0)
Hemoglobin: 16.3 g/dL (ref 13.0–17.0)
MCHC: 33.2 g/dL (ref 30.0–36.0)
MCV: 88.1 fl (ref 78.0–100.0)
Platelets: 194 K/uL (ref 150.0–400.0)
RBC: 5.56 Mil/uL (ref 4.22–5.81)
RDW: 14.2 % (ref 11.5–15.5)
WBC: 3.7 K/uL — ABNORMAL LOW (ref 4.0–10.5)

## 2024-01-11 LAB — LIPID PANEL
Cholesterol: 202 mg/dL — ABNORMAL HIGH (ref 0–200)
HDL: 60.7 mg/dL (ref >39.00)
LDL Cholesterol: 125 mg/dL — ABNORMAL HIGH (ref 0–99)
NonHDL: 140.9
Total CHOL/HDL Ratio: 3
Triglycerides: 78 mg/dL (ref 0.0–149.0)
VLDL: 15.6 mg/dL (ref 0.0–40.0)

## 2024-01-11 LAB — HEMOGLOBIN A1C: Hgb A1c MFr Bld: 6 % (ref 4.6–6.5)

## 2024-01-11 LAB — PSA: PSA: 1.33 ng/mL (ref 0.10–4.00)

## 2024-01-16 NOTE — Progress Notes (Unsigned)
  Healthcare at [facility] [address] [place], [place] [address] [phone]  Date:  01/20/2024   Name:  Younis Mathey III   DOB:  1955-06-03   MRN:  [id number]  PCP:  Watt Harlene BROCKS, MD    Chief Complaint: No chief complaint on file.   History of Present Illness:  Orley Lawry III is a 68 y.o. very pleasant male patient who presents with the following:  Patient seen today for physical exam.  I saw him most recently in May 2024 Retired from his work in Publishing copy. 2 children and 5 grands, involved in caring for his aging parents - he notes they are doing well despite being quite elderly -both in their 90s   Enjoys exercise with cardio and strength, loves using his boat and being on the water   Flu vaccine Recommend COVID booster this fall Colon cancer screening is up to date He has completed Shingrix Blood work done earlier this month, already on chart Note minimal leukopenia, can repeat today if he would like  Lab Results  Component Value Date   PSA 1.33 01/11/2024   PSA 1.76 09/17/2022   PSA 1.15 10/07/2021      Results for orders placed or performed in visit on 01/11/24  PSA   Collection Time: 01/11/24  8:53 AM  Result Value Ref Range   PSA 1.33 0.10 - 4.00 ng/mL  Lipid panel   Collection Time: 01/11/24  8:53 AM  Result Value Ref Range   Cholesterol 202 (H) 0 - 200 mg/dL   Triglycerides 21.9 0.0 - 149.0 mg/dL   HDL 39.29 >60.99 mg/dL   VLDL 84.3 0.0 - 59.9 mg/dL   LDL Cholesterol 874 (H) 0 - 99 mg/dL   Total CHOL/HDL Ratio 3    NonHDL 140.90   Hemoglobin A1c   Collection Time: 01/11/24  8:53 AM  Result Value Ref Range   Hgb A1c MFr Bld 6.0 4.6 - 6.5 %  Comprehensive metabolic panel with GFR   Collection Time: 01/11/24  8:53 AM  Result Value Ref Range   Sodium 139 135 - 145 mEq/L   Potassium 4.7 3.5 - 5.1 mEq/L   Chloride 102 96 - 112 mEq/L   CO2 31 19 - 32 mEq/L   Glucose, Bld 85 70 - 99  mg/dL   BUN 20 6 - 23 mg/dL   Creatinine, Ser 8.83 0.40 - 1.50 mg/dL   Total Bilirubin 0.9 0.2 - 1.2 mg/dL   Alkaline Phosphatase 31 (L) 39 - 117 U/L   AST 16 0 - 37 U/L   ALT 15 0 - 53 U/L   Total Protein 6.7 6.0 - 8.3 g/dL   Albumin 4.5 3.5 - 5.2 g/dL   GFR 35.89 >39.99 mL/min   Calcium  9.7 8.4 - 10.5 mg/dL  CBC   Collection Time: 01/11/24  8:53 AM  Result Value Ref Range   WBC 3.7 (L) 4.0 - 10.5 K/uL   RBC 5.56 4.22 - 5.81 Mil/uL   Platelets 194.0 150.0 - 400.0 K/uL   Hemoglobin 16.3 13.0 - 17.0 g/dL   HCT 50.9 60.9 - 47.9 %   MCV 88.1 78.0 - 100.0 fl   MCHC 33.2 30.0 - 36.0 g/dL   RDW 85.7 88.4 - 84.4 %   Discussed the use of AI scribe software for clinical note transcription with the patient, who gave verbal consent to proceed.  History of Present Illness    Patient  Active Problem List   Diagnosis Date Noted   Gastroesophageal reflux disease 09/15/2022    No past medical history on file.  Past Surgical History:  Procedure Laterality Date   HERNIA REPAIR      Social History   Tobacco Use   Smoking status: Never   Smokeless tobacco: Never  Vaping Use   Vaping status: Never Used  Substance Use Topics   Alcohol use: Yes    Comment: very very little   Drug use: Never    No family history on file.  Allergies  Allergen Reactions   Codeine Hives and Other (See Comments)    Heart problems Heart problems     Medication list has been reviewed and updated.  Current Outpatient Medications on File Prior to Visit  Medication Sig Dispense Refill   diphenhydramine -acetaminophen (TYLENOL PM) 25-500 MG TABS tablet Take 1 tablet by mouth at bedtime as needed. (Patient not taking: Reported on 01/08/2024)     loratadine (CLARITIN) 10 MG tablet Take 10 mg by mouth daily. (Patient not taking: Reported on 01/08/2024)     omeprazole  (PRILOSEC) 40 MG capsule Take 1 capsule (40 mg total) by mouth daily. Use as needed for GERD sx (Patient not taking: Reported on 01/08/2024)  90 capsule 3   rosuvastatin  (CRESTOR ) 10 MG tablet Take 1 tablet (10 mg total) by mouth daily. (Patient not taking: Reported on 01/08/2024) 90 tablet 3   No current facility-administered medications on file prior to visit.    Review of Systems:  As per HPI- otherwise negative.   Physical Examination: There were no vitals filed for this visit. There were no vitals filed for this visit. There is no height or weight on file to calculate BMI. Ideal Body Weight:    GEN: no acute distress. HEENT: Atraumatic, Normocephalic.  Ears and Nose: No external deformity. CV: RRR, No M/G/R. No JVD. No thrill. No extra heart sounds. PULM: CTA B, no wheezes, crackles, rhonchi. No retractions. No resp. distress. No accessory muscle use. ABD: S, NT, ND, +BS. No rebound. No HSM. EXTR: No c/c/e PSYCH: Normally interactive. Conversant.    Assessment and Plan: No diagnosis found.  Assessment & Plan   Signed Harlene Schroeder, MD

## 2024-01-20 ENCOUNTER — Encounter: Payer: Self-pay | Admitting: Family Medicine

## 2024-01-20 ENCOUNTER — Ambulatory Visit: Admitting: Family Medicine

## 2024-01-20 VITALS — BP 120/70 | HR 66 | Ht 74.0 in

## 2024-01-20 DIAGNOSIS — Z0000 Encounter for general adult medical examination without abnormal findings: Secondary | ICD-10-CM | POA: Diagnosis not present

## 2024-01-20 DIAGNOSIS — R911 Solitary pulmonary nodule: Secondary | ICD-10-CM

## 2024-01-20 DIAGNOSIS — Z23 Encounter for immunization: Secondary | ICD-10-CM | POA: Diagnosis not present

## 2024-01-20 NOTE — Patient Instructions (Addendum)
 Good to see you today!  Please see me in about 6 months to check on your blood sugar and slightly low blood counts We are late to follow-up the lung nodule seen last summer.  I ordered this for you now- please be sure this gets done in the next couple of weeks, let me know if you don't hear from imaging to set this up   Phone: [phone]  Flu shot given today Recommend covid booster this fall if not done already

## 2024-01-27 ENCOUNTER — Ambulatory Visit (HOSPITAL_BASED_OUTPATIENT_CLINIC_OR_DEPARTMENT_OTHER)
Admission: RE | Admit: 2024-01-27 | Discharge: 2024-01-27 | Disposition: A | Discharge status: Home / Self Care | Source: Ambulatory Visit | Attending: Family Medicine | Admitting: Family Medicine

## 2024-01-27 DIAGNOSIS — R911 Solitary pulmonary nodule: Secondary | ICD-10-CM | POA: Insufficient documentation

## 2024-01-31 ENCOUNTER — Encounter: Payer: Self-pay | Admitting: Family Medicine

## 2025-01-17 ENCOUNTER — Ambulatory Visit
